# Patient Record
Sex: Male | Born: 1940
Health system: Southern US, Community
[De-identification: ages and names within clinical notes are randomized; demographics above are authoritative.]

## PROBLEM LIST (undated history)

## (undated) DIAGNOSIS — C801 Malignant (primary) neoplasm, unspecified: Secondary | ICD-10-CM

## (undated) DIAGNOSIS — N401 Enlarged prostate with lower urinary tract symptoms: Secondary | ICD-10-CM

## (undated) DIAGNOSIS — R351 Nocturia: Secondary | ICD-10-CM

## (undated) DIAGNOSIS — E785 Hyperlipidemia, unspecified: Secondary | ICD-10-CM

## (undated) DIAGNOSIS — I10 Essential (primary) hypertension: Secondary | ICD-10-CM

## (undated) DIAGNOSIS — Z974 Presence of external hearing-aid: Secondary | ICD-10-CM

## (undated) DIAGNOSIS — Z973 Presence of spectacles and contact lenses: Secondary | ICD-10-CM

## (undated) DIAGNOSIS — K219 Gastro-esophageal reflux disease without esophagitis: Secondary | ICD-10-CM

## (undated) DIAGNOSIS — M1990 Unspecified osteoarthritis, unspecified site: Secondary | ICD-10-CM

## (undated) HISTORY — PX: INGUINAL HERNIA REPAIR: SUR1180

## (undated) HISTORY — DX: Hyperlipidemia, unspecified: E78.5

## (undated) HISTORY — DX: Essential (primary) hypertension: I10

## (undated) HISTORY — PX: APPENDECTOMY: SHX54

## (undated) HISTORY — PX: TONSILLECTOMY: SUR1361

## (undated) HISTORY — PX: CATARACT EXTRACTION W/ INTRAOCULAR LENS  IMPLANT, BILATERAL: SHX1307

---

## 1974-09-11 HISTORY — PX: TYMPANOPLASTY: SHX33

## 2007-09-12 HISTORY — PX: ROTATOR CUFF REPAIR: SHX139

## 2011-09-18 DIAGNOSIS — N401 Enlarged prostate with lower urinary tract symptoms: Secondary | ICD-10-CM | POA: Diagnosis not present

## 2011-09-18 DIAGNOSIS — R972 Elevated prostate specific antigen [PSA]: Secondary | ICD-10-CM | POA: Diagnosis not present

## 2011-09-18 DIAGNOSIS — R3129 Other microscopic hematuria: Secondary | ICD-10-CM | POA: Diagnosis not present

## 2011-10-23 DIAGNOSIS — Z9089 Acquired absence of other organs: Secondary | ICD-10-CM | POA: Diagnosis not present

## 2011-10-23 DIAGNOSIS — Z1211 Encounter for screening for malignant neoplasm of colon: Secondary | ICD-10-CM | POA: Diagnosis not present

## 2011-10-23 DIAGNOSIS — K5730 Diverticulosis of large intestine without perforation or abscess without bleeding: Secondary | ICD-10-CM | POA: Diagnosis not present

## 2011-10-23 DIAGNOSIS — D126 Benign neoplasm of colon, unspecified: Secondary | ICD-10-CM | POA: Diagnosis not present

## 2011-10-23 DIAGNOSIS — K648 Other hemorrhoids: Secondary | ICD-10-CM | POA: Diagnosis not present

## 2011-10-23 DIAGNOSIS — N400 Enlarged prostate without lower urinary tract symptoms: Secondary | ICD-10-CM | POA: Diagnosis not present

## 2011-11-16 DIAGNOSIS — N401 Enlarged prostate with lower urinary tract symptoms: Secondary | ICD-10-CM | POA: Diagnosis not present

## 2011-11-16 DIAGNOSIS — R972 Elevated prostate specific antigen [PSA]: Secondary | ICD-10-CM | POA: Diagnosis not present

## 2011-12-06 DIAGNOSIS — N401 Enlarged prostate with lower urinary tract symptoms: Secondary | ICD-10-CM | POA: Diagnosis not present

## 2011-12-06 DIAGNOSIS — R3129 Other microscopic hematuria: Secondary | ICD-10-CM | POA: Diagnosis not present

## 2011-12-06 DIAGNOSIS — R972 Elevated prostate specific antigen [PSA]: Secondary | ICD-10-CM | POA: Diagnosis not present

## 2011-12-13 DIAGNOSIS — K648 Other hemorrhoids: Secondary | ICD-10-CM | POA: Diagnosis not present

## 2011-12-13 DIAGNOSIS — K625 Hemorrhage of anus and rectum: Secondary | ICD-10-CM | POA: Diagnosis not present

## 2011-12-27 DIAGNOSIS — K6289 Other specified diseases of anus and rectum: Secondary | ICD-10-CM | POA: Diagnosis not present

## 2011-12-27 DIAGNOSIS — K648 Other hemorrhoids: Secondary | ICD-10-CM | POA: Diagnosis not present

## 2012-01-10 DIAGNOSIS — K648 Other hemorrhoids: Secondary | ICD-10-CM | POA: Diagnosis not present

## 2012-01-10 DIAGNOSIS — K625 Hemorrhage of anus and rectum: Secondary | ICD-10-CM | POA: Diagnosis not present

## 2012-01-15 ENCOUNTER — Other Ambulatory Visit: Payer: Self-pay | Admitting: Internal Medicine

## 2012-01-15 DIAGNOSIS — R3129 Other microscopic hematuria: Secondary | ICD-10-CM

## 2012-01-17 ENCOUNTER — Ambulatory Visit
Admission: RE | Admit: 2012-01-17 | Discharge: 2012-01-17 | Disposition: A | Discharge status: Home / Self Care | Payer: Medicare Other | Source: Ambulatory Visit | Attending: Internal Medicine | Admitting: Internal Medicine

## 2012-01-17 DIAGNOSIS — N281 Cyst of kidney, acquired: Secondary | ICD-10-CM | POA: Diagnosis not present

## 2012-01-17 DIAGNOSIS — N400 Enlarged prostate without lower urinary tract symptoms: Secondary | ICD-10-CM | POA: Diagnosis not present

## 2012-01-17 DIAGNOSIS — N200 Calculus of kidney: Secondary | ICD-10-CM | POA: Diagnosis not present

## 2012-01-17 DIAGNOSIS — R3129 Other microscopic hematuria: Secondary | ICD-10-CM

## 2012-01-26 DIAGNOSIS — H2510 Age-related nuclear cataract, unspecified eye: Secondary | ICD-10-CM | POA: Diagnosis not present

## 2012-07-17 DIAGNOSIS — M899 Disorder of bone, unspecified: Secondary | ICD-10-CM | POA: Diagnosis not present

## 2012-07-17 DIAGNOSIS — E785 Hyperlipidemia, unspecified: Secondary | ICD-10-CM | POA: Diagnosis not present

## 2012-07-17 DIAGNOSIS — Z23 Encounter for immunization: Secondary | ICD-10-CM | POA: Diagnosis not present

## 2012-07-17 DIAGNOSIS — I10 Essential (primary) hypertension: Secondary | ICD-10-CM | POA: Diagnosis not present

## 2012-07-17 DIAGNOSIS — Z125 Encounter for screening for malignant neoplasm of prostate: Secondary | ICD-10-CM | POA: Diagnosis not present

## 2012-07-24 DIAGNOSIS — R972 Elevated prostate specific antigen [PSA]: Secondary | ICD-10-CM | POA: Diagnosis not present

## 2012-07-24 DIAGNOSIS — Z0000 Encounter for general adult medical examination without abnormal findings: Secondary | ICD-10-CM | POA: Diagnosis not present

## 2012-07-24 DIAGNOSIS — Z1331 Encounter for screening for depression: Secondary | ICD-10-CM | POA: Diagnosis not present

## 2012-07-24 DIAGNOSIS — I10 Essential (primary) hypertension: Secondary | ICD-10-CM | POA: Diagnosis not present

## 2012-07-24 DIAGNOSIS — E785 Hyperlipidemia, unspecified: Secondary | ICD-10-CM | POA: Diagnosis not present

## 2012-07-25 DIAGNOSIS — Z1212 Encounter for screening for malignant neoplasm of rectum: Secondary | ICD-10-CM | POA: Diagnosis not present

## 2012-07-30 DIAGNOSIS — M719 Bursopathy, unspecified: Secondary | ICD-10-CM | POA: Diagnosis not present

## 2012-07-30 DIAGNOSIS — M67919 Unspecified disorder of synovium and tendon, unspecified shoulder: Secondary | ICD-10-CM | POA: Diagnosis not present

## 2012-07-30 DIAGNOSIS — IMO0002 Reserved for concepts with insufficient information to code with codable children: Secondary | ICD-10-CM | POA: Diagnosis not present

## 2012-08-15 DIAGNOSIS — H903 Sensorineural hearing loss, bilateral: Secondary | ICD-10-CM | POA: Diagnosis not present

## 2012-08-15 DIAGNOSIS — H9319 Tinnitus, unspecified ear: Secondary | ICD-10-CM | POA: Diagnosis not present

## 2012-12-12 DIAGNOSIS — R21 Rash and other nonspecific skin eruption: Secondary | ICD-10-CM | POA: Diagnosis not present

## 2012-12-26 DIAGNOSIS — L988 Other specified disorders of the skin and subcutaneous tissue: Secondary | ICD-10-CM | POA: Diagnosis not present

## 2012-12-26 DIAGNOSIS — L989 Disorder of the skin and subcutaneous tissue, unspecified: Secondary | ICD-10-CM | POA: Diagnosis not present

## 2012-12-26 DIAGNOSIS — IMO0002 Reserved for concepts with insufficient information to code with codable children: Secondary | ICD-10-CM | POA: Diagnosis not present

## 2012-12-31 DIAGNOSIS — H2510 Age-related nuclear cataract, unspecified eye: Secondary | ICD-10-CM | POA: Diagnosis not present

## 2012-12-31 DIAGNOSIS — L989 Disorder of the skin and subcutaneous tissue, unspecified: Secondary | ICD-10-CM | POA: Diagnosis not present

## 2012-12-31 DIAGNOSIS — Z4802 Encounter for removal of sutures: Secondary | ICD-10-CM | POA: Diagnosis not present

## 2013-01-15 DIAGNOSIS — N200 Calculus of kidney: Secondary | ICD-10-CM | POA: Diagnosis not present

## 2013-01-15 DIAGNOSIS — R972 Elevated prostate specific antigen [PSA]: Secondary | ICD-10-CM | POA: Diagnosis not present

## 2013-01-15 DIAGNOSIS — N401 Enlarged prostate with lower urinary tract symptoms: Secondary | ICD-10-CM | POA: Diagnosis not present

## 2013-08-19 DIAGNOSIS — Z1212 Encounter for screening for malignant neoplasm of rectum: Secondary | ICD-10-CM | POA: Diagnosis not present

## 2013-09-30 DIAGNOSIS — H5210 Myopia, unspecified eye: Secondary | ICD-10-CM | POA: Diagnosis not present

## 2013-09-30 DIAGNOSIS — H2510 Age-related nuclear cataract, unspecified eye: Secondary | ICD-10-CM | POA: Diagnosis not present

## 2013-09-30 DIAGNOSIS — H3530 Unspecified macular degeneration: Secondary | ICD-10-CM | POA: Diagnosis not present

## 2013-10-16 DIAGNOSIS — H2589 Other age-related cataract: Secondary | ICD-10-CM | POA: Diagnosis not present

## 2013-10-16 DIAGNOSIS — H2510 Age-related nuclear cataract, unspecified eye: Secondary | ICD-10-CM | POA: Diagnosis not present

## 2014-01-27 DIAGNOSIS — D485 Neoplasm of uncertain behavior of skin: Secondary | ICD-10-CM | POA: Diagnosis not present

## 2014-01-27 DIAGNOSIS — L820 Inflamed seborrheic keratosis: Secondary | ICD-10-CM | POA: Diagnosis not present

## 2014-01-27 DIAGNOSIS — L821 Other seborrheic keratosis: Secondary | ICD-10-CM | POA: Diagnosis not present

## 2014-04-13 DIAGNOSIS — K4090 Unilateral inguinal hernia, without obstruction or gangrene, not specified as recurrent: Secondary | ICD-10-CM | POA: Diagnosis not present

## 2014-04-28 ENCOUNTER — Encounter (INDEPENDENT_AMBULATORY_CARE_PROVIDER_SITE_OTHER): Payer: Self-pay | Admitting: Surgery

## 2014-04-28 ENCOUNTER — Ambulatory Visit (INDEPENDENT_AMBULATORY_CARE_PROVIDER_SITE_OTHER): Payer: Medicare Other | Admitting: Surgery

## 2014-04-28 VITALS — BP 126/80 | HR 55 | Temp 97.0°F | Ht 63.0 in | Wt 131.0 lb

## 2014-04-28 DIAGNOSIS — N400 Enlarged prostate without lower urinary tract symptoms: Secondary | ICD-10-CM | POA: Diagnosis not present

## 2014-04-28 DIAGNOSIS — R109 Unspecified abdominal pain: Secondary | ICD-10-CM | POA: Diagnosis not present

## 2014-04-28 DIAGNOSIS — R1032 Left lower quadrant pain: Secondary | ICD-10-CM

## 2014-04-28 MED ORDER — NAPROXEN 500 MG PO TABS: 500.0000 mg | ORAL_TABLET | Freq: Two times a day (BID) | ORAL | Status: DC

## 2014-04-28 NOTE — Patient Instructions (Signed)
Please consider the recommendations that we have given you today:  Try a regimen of ice/heat/anti-inflammatory nonsteroidal (naproxen aka Aleve).  Do that regularly for 3 weeks.  If worse or not better, consider laparoscopic surgical exploration and repair of probable recurrent hernia.  See the Handout(s) we have given you.  Please call our office at [phone] if you wish to schedule surgery or if you have further questions / concerns.   Groin Strain A groin strain (also called a groin pull) is an injury to the muscles or tendon on the upper inner part of the thigh. These muscles are called the adductor muscles or groin muscles. They are responsible for moving the leg across the body. A muscle strain occurs when a muscle is overstretched and some muscle fibers are torn. A groin strain can range from mild to severe depending on how many muscle fibers are affected and whether the muscle fibers are partially or completely torn.  Groin strains usually occur during exercise or participation in sports. The injury often happens when a sudden, violent force is placed on a muscle, stretching the muscle too far. A strain is more likely to occur when your muscles are not warmed up or if you are not properly conditioned. Depending on the severity of the groin strain, recovery time may vary from a few weeks to several weeks. Severe injuries often require 4-6 weeks for recovery. In these cases, complete healing can take 4-5 months.  CAUSES   Stretching the groin muscles too far or too suddenly, often during side-to-side motion with an abrupt change in direction.  Putting repeated stress on the groin muscles over a long period of time.  Performing vigorous activity without properly stretching the groin muscles beforehand. SYMPTOMS   Pain and tenderness in the groin area. This begins as sharp pain and persists as a dull ache.  Popping or snapping feeling when the injury occurs (for severe  strains).  Swelling or bruising.  Muscle spasms.  Weakness in the leg.  Stiffness in the groin area with decreased ability to move the affected muscles. DIAGNOSIS  Your caregiver will perform a physical exam to diagnose a groin strain. You will be asked about your symptoms and how the injury occurred. X-rays are sometimes needed to rule out a broken bone or cartilage problems. Your caregiver may order a CT scan or MRI if a complete muscle tear is suspected. TREATMENT  A groin strain will often heal on its own. Your caregiver may prescribe medicines to help manage pain and swelling (anti-inflammatory medicine). You may be told to use crutches for the first few days to minimize your pain. HOME CARE INSTRUCTIONS   Rest. Do not use the strained muscle if it causes pain.  Put ice on the injured area.  Put ice in a plastic bag.  Place a towel between your skin and the bag.  Leave the ice on for 15-20 minutes, every 2-3 hours. Do this for the first 2 days after the injury.  Only take over-the-counter or prescription medicines as directed by your caregiver.  Wrap the injured area with an elastic bandage as directed by your caregiver.  Keep the injured leg raised (elevated).  Walk, stretch, and perform range-of-motion exercises to improve blood flow to the injured area. Only perform these activities if you can do so without any pain. To prevent muscle strains:  Warm up before exercise.  Develop proper conditioning and strength in the groin muscles. SEEK IMMEDIATE MEDICAL CARE IF:  You have increased pain or swelling in the affected area.   Your symptoms are not improving or are getting worse. MAKE SURE YOU:   Understand these instructions.  Will watch your condition.  Will get help right away if you are not doing well or get worse. Document Released: 04/25/2004 Document Revised: 08/14/2012 Document Reviewed: 05/01/2012 [facility] Patient Information 2015 [place], [place].  This information is not intended to replace advice given to you by your health care provider. Make sure you discuss any questions you have with your health care provider.  Managing Pain  Pain after surgery or related to activity is often due to strain/injury to muscle, tendon, nerves and/or incisions.  This pain is usually short-term and will improve in a few months.   Many people find it helpful to do the following things TOGETHER to help speed the process of healing and to get back to regular activity more quickly:  1. Avoid heavy physical activity a.  no lifting greater than 20 pounds b. Do not "push through" the pain.  Listen to your body and avoid positions and maneuvers than reproduce the pain c. Walking is okay as tolerated, but go slowly and stop when getting sore.  d. Remember: If it hurts to do it, then don't do it! 2. Take Anti-inflammatory medication  a. Take with food/snack around the clock for 1-2 weeks i. This helps the muscle and nerve tissues become less irritable and calm down faster b. Choose ONE of the following over-the-counter medications: i. Naproxen 23m tabs (ex. Aleve) 1-2 pills twice a day  ii. Ibuprofen 2075mtabs (ex. Advil, Motrin) 3-4 pills with every meal and just before bedtime iii. Acetaminophen 50020mabs (Tylenol) 1-2 pills with every meal and just before bedtime 3. Use a Heating pad or Ice/Cold Pack a. 4-6 times a day b. May use warm bath/hottub  or showers 4. Try Gentle Massage and/or Stretching  a. at the area of pain many times a day b. stop if you feel pain - do not overdo it  Try these steps together to help you body heal faster and avoid making things get worse.  Doing just one of these things may not be enough.    If you are not getting better after two weeks or are noticing you are getting worse, contact our office for further advice; we may need to re-evaluate you & see what other things we can do to help.    Hernia A hernia occurs when  an internal organ pushes out through a weak spot in the abdominal wall. Hernias most commonly occur in the groin and around the navel. Hernias often can be pushed back into place (reduced). Most hernias tend to get worse over time. Some abdominal hernias can get stuck in the opening (irreducible or incarcerated hernia) and cannot be reduced. An irreducible abdominal hernia which is tightly squeezed into the opening is at risk for impaired blood supply (strangulated hernia). A strangulated hernia is a medical emergency. Because of the risk for an irreducible or strangulated hernia, surgery may be recommended to repair a hernia. CAUSES   Heavy lifting.  Prolonged coughing.  Straining to have a bowel movement.  A cut (incision) made during an abdominal surgery. HOME CARE INSTRUCTIONS   Bed rest is not required. You may continue your normal activities.  Avoid lifting more than 10 pounds (4.5 kg) or straining.  Cough gently. If you are a smoker it is best to stop. Even the best hernia repair can  break down with the continual strain of coughing. Even if you do not have your hernia repaired, a cough will continue to aggravate the problem.  Do not wear anything tight over your hernia. Do not try to keep it in with an outside bandage or truss. These can damage abdominal contents if they are trapped within the hernia sac.  Eat a normal diet.  Avoid constipation. Straining over long periods of time will increase hernia size and encourage breakdown of repairs. If you cannot do this with diet alone, stool softeners may be used. SEEK IMMEDIATE MEDICAL CARE IF:   You have a fever.  You develop increasing abdominal pain.  You feel nauseous or vomit.  Your hernia is stuck outside the abdomen, looks discolored, feels hard, or is tender.  You have any changes in your bowel habits or in the hernia that are unusual for you.  You have increased pain or swelling around the hernia.  You cannot push the  hernia back in place by applying gentle pressure while lying down. MAKE SURE YOU:   Understand these instructions.  Will watch your condition.  Will get help right away if you are not doing well or get worse. Document Released: 08/28/2005 Document Revised: 11/20/2011 Document Reviewed: 04/16/2008 [facility]  Patient Information 2015 [employer] [employer], [place]. This information is not intended to replace advice given to you by your health care provider. Make sure you discuss any questions you have with your health care provider.  Exercise to Stay Healthy Exercise helps you become and stay healthy. EXERCISE IDEAS AND TIPS Choose exercises that:  You enjoy.  Fit into your day. You do not need to exercise really hard to be healthy. You can do exercises at a slow or medium level and stay healthy. You can:  Stretch before and after working out.  Try yoga, Pilates, or tai chi.  Lift weights.  Walk fast, swim, jog, run, climb stairs, bicycle, dance, or rollerskate.  Take aerobic classes. Exercises that burn about 150 calories:  Running 1  miles in 15 minutes.  Playing volleyball for 45 to 60 minutes.  Washing and waxing a car for 45 to 60 minutes.  Playing touch football for 45 minutes.  Walking 1  miles in 35 minutes.  Pushing a stroller 1  miles in 30 minutes.  Playing basketball for 30 minutes.  Raking leaves for 30 minutes.  Bicycling 5 miles in 30 minutes.  Walking 2 miles in 30 minutes.  Dancing for 30 minutes.  Shoveling snow for 15 minutes.  Swimming laps for 20 minutes.  Walking up stairs for 15 minutes.  Bicycling 4 miles in 15 minutes.  Gardening for 30 to 45 minutes.  Jumping rope for 15 minutes.  Washing windows or floors for 45 to 60 minutes. Document Released: 09/30/2010 Document Revised: 11/20/2011 Document Reviewed: 09/30/2010 [facility] Patient Information 2015 [place], [place]. This information is not intended to replace advice given to you by your  health care provider. Make sure you discuss any questions you have with your health care provider.

## 2014-04-28 NOTE — Progress Notes (Signed)
Subjective:     Patient ID: Todd Myers, male   DOB: Jan 22, 1941, 73 y.o.   MRN: [id number]  HPI  Note: Portions of this report may have been transcribed using voice recognition software. Every effort was made to ensure accuracy; however, inadvertent computerized transcription errors may be present.   Any transcriptional errors that result from this process are unintentional.            Todd Myers  05-Feb-1941 [id number]  Patient Care Team: Marton Redwood, MD as PCP - General (Internal Medicine) Avanell Shackleton, NP as Nurse Practitioner (Internal Medicine)  This patient is a 73 y.o.male who presents today for surgical evaluation at the request of Darrelyn Hillock, NP.   Reason for visit: Left groin pain/swelling ?recurrent ehernia  Pleasant active male.  He comes today by himself.  History of inguinal hernias repaired.  1st time in [place], [place].  Recurred.  Rerepaired at [place], [place] in 1990s.  Open incisions.  He started working out at the gym to get a better shape and lose weight.  Noticed worsening left groin pain with lifting activities.  Did not resolve.  Persisted for a while.  He felt some groin swelling.  He went to his primary care office.  They were concerned about hernia recurrence.  Surgical consultation requested.  History of elevated PSA but negative biopsies.  No difficulty with urination.  Has bowel movements every day.  Can walk 4 miles without difficulty.  No history of infections.  Has not smoked in over 2 decades.  There are no active problems to display for this patient.  History reviewed. No pertinent past medical history.  Past Surgical History  Procedure Laterality Date  . Appendectomy    . Hernia repair    . Rotator cuff repair    . Cataract extraction      History   Social History  . Marital Status: Married    Spouse Name: N/A    Number of Children: N/A  . Years of Education: N/A   Occupational History  . Not on file.   Social  History Main Topics  . Smoking status: Former Smoker    Types: Cigarettes    Quit date: 04/29/1991  . Smokeless tobacco: Not on file  . Alcohol Use: Yes  . Drug Use: No  . Sexual Activity: Not on file   Other Topics Concern  . Not on file   Social History Narrative  . No narrative on file    Family History  Problem Relation Age of Onset  . COPD Mother   . Heart disease Father     Current Outpatient Prescriptions  Medication Sig Dispense Refill  . aspirin 81 MG tablet Take 81 mg by mouth daily.      . cholecalciferol (VITAMIN D) 400 UNITS TABS tablet Take 400 Units by mouth.      . irbesartan (AVAPRO) 150 MG tablet       . simvastatin (ZOCOR) 20 MG tablet        No current facility-administered medications for this visit.     Allergies  Allergen Reactions  . Sulfa Antibiotics     BP 126/80  Pulse 55  Temp(Src) 97 F (36.1 C)  Ht 5\' 3"  (1.6 m)  Wt 131 lb (59.421 kg)  BMI 23.21 kg/m2  No results found.   Review of Systems  Constitutional: Negative for fever, chills and diaphoresis.  HENT: Negative for ear discharge, facial swelling, mouth sores, nosebleeds, sore throat and trouble swallowing.  Eyes: Negative for photophobia, discharge and visual disturbance.  Respiratory: Negative for choking, chest tightness, shortness of breath and stridor.   Cardiovascular: Negative for chest pain and palpitations.  Gastrointestinal: Negative for nausea, vomiting, abdominal pain, diarrhea, constipation, blood in stool, abdominal distention, anal bleeding and rectal pain.  Endocrine: Negative for cold intolerance and heat intolerance.  Genitourinary: Negative for dysuria, urgency, difficulty urinating and testicular pain.  Musculoskeletal: Negative for arthralgias, back pain, gait problem and myalgias.  Skin: Negative for color change, pallor, rash and wound.  Allergic/Immunologic: Negative for environmental allergies and food allergies.  Neurological: Negative for  dizziness, speech difficulty, weakness, numbness and headaches.  Hematological: Negative for adenopathy. Does not bruise/bleed easily.  Psychiatric/Behavioral: Negative for hallucinations, confusion and agitation.       Objective:   Physical Exam  Constitutional: He is oriented to person, place, and time. He appears well-developed and well-nourished. No distress.  HENT:  Head: Normocephalic.  Mouth/Throat: Oropharynx is clear and moist. No oropharyngeal exudate.  Eyes: Conjunctivae and EOM are normal. Pupils are equal, round, and reactive to light. No scleral icterus.  Neck: Normal range of motion. Neck supple. No tracheal deviation present.  Cardiovascular: Normal rate, regular rhythm and intact distal pulses.   Pulmonary/Chest: Effort normal and breath sounds normal. No respiratory distress.  Abdominal: Soft. He exhibits no distension. There is no tenderness. Hernia confirmed negative in the right inguinal area.  Genitourinary: Penis normal.    Right testis shows no mass, no swelling and no tenderness. Left testis shows no mass, no swelling and no tenderness. Uncircumcised.  Musculoskeletal: Normal range of motion. He exhibits no tenderness.  Lymphadenopathy:    He has no cervical adenopathy.       Right: No inguinal adenopathy present.       Left: No inguinal adenopathy present.  Neurological: He is alert and oriented to person, place, and time. No cranial nerve deficit. He exhibits normal muscle tone. Coordination normal.  Skin: Skin is warm and dry. No rash noted. He is not diaphoretic. No erythema. No pallor.  Psychiatric: He has a normal mood and affect. His behavior is normal. Judgment and thought content normal.       Assessment:     The left groin pain after intense physical work canal.  Probable groin strain vs. Hernia recurrence.     Plan:     Because he has had recurrent inguinal hernias, this could be another one.  However, I do not feel a definite one on  exam.  It is reasonable to do a trial of anti-inflammatory treatment.  IF his symptoms totally resolve in 3-6 weeks, followup when necessary.  If symptoms do not resolve, or if they worsen, or if he has a new swelling; then, consider laparoscopic exploration and repair of probable recurrent hernias.  May have femoral hernia since radiates down to left inner thigh.  He prefers to try close observation and anti-inflammatories before proceeding with surgery:  The anatomy & physiology of the abdominal wall and pelvic floor was discussed.  The pathophysiology of hernias in the inguinal and pelvic region was discussed.  Natural history risks such as progressive enlargement, pain, incarceration & strangulation was discussed.   Contributors to complications such as smoking, obesity, diabetes, prior surgery, etc were discussed.    I feel the risks of no intervention will lead to serious problems that outweigh the operative risks; therefore, I recommended surgery to reduce and repair the hernia.  I explained laparoscopic techniques with possible need  for an open approach.  I noted usual use of mesh to patch and/or buttress hernia repair  Risks such as bleeding, infection, abscess, need for further treatment, heart attack, death, and other risks were discussed.  I noted a good likelihood this will help address the problem.   Goals of post-operative recovery were discussed as well.  Possibility that this will not correct all symptoms was explained.  I stressed the importance of low-impact activity, aggressive pain control, avoiding constipation, & not pushing through pain to minimize risk of post-operative chronic pain or injury. Possibility of reherniation was discussed.  We will work to minimize complications.     An educational handout further explaining the pathology & treatment options was given as well.  Questions were answered.  The patient expresses understanding.

## 2014-05-15 DIAGNOSIS — R109 Unspecified abdominal pain: Secondary | ICD-10-CM | POA: Diagnosis not present

## 2014-06-30 DIAGNOSIS — Z23 Encounter for immunization: Secondary | ICD-10-CM | POA: Diagnosis not present

## 2014-07-29 DIAGNOSIS — I10 Essential (primary) hypertension: Secondary | ICD-10-CM | POA: Diagnosis not present

## 2014-07-29 DIAGNOSIS — Z0000 Encounter for general adult medical examination without abnormal findings: Secondary | ICD-10-CM | POA: Diagnosis not present

## 2014-07-29 DIAGNOSIS — R8299 Other abnormal findings in urine: Secondary | ICD-10-CM | POA: Diagnosis not present

## 2014-07-29 DIAGNOSIS — M859 Disorder of bone density and structure, unspecified: Secondary | ICD-10-CM | POA: Diagnosis not present

## 2014-07-29 DIAGNOSIS — Z125 Encounter for screening for malignant neoplasm of prostate: Secondary | ICD-10-CM | POA: Diagnosis not present

## 2014-07-29 DIAGNOSIS — E785 Hyperlipidemia, unspecified: Secondary | ICD-10-CM | POA: Diagnosis not present

## 2014-08-03 DIAGNOSIS — Z6823 Body mass index (BMI) 23.0-23.9, adult: Secondary | ICD-10-CM | POA: Diagnosis not present

## 2014-08-03 DIAGNOSIS — R972 Elevated prostate specific antigen [PSA]: Secondary | ICD-10-CM | POA: Diagnosis not present

## 2014-08-03 DIAGNOSIS — Z23 Encounter for immunization: Secondary | ICD-10-CM | POA: Diagnosis not present

## 2014-08-03 DIAGNOSIS — E785 Hyperlipidemia, unspecified: Secondary | ICD-10-CM | POA: Diagnosis not present

## 2014-08-03 DIAGNOSIS — N401 Enlarged prostate with lower urinary tract symptoms: Secondary | ICD-10-CM | POA: Diagnosis not present

## 2014-08-03 DIAGNOSIS — M542 Cervicalgia: Secondary | ICD-10-CM | POA: Diagnosis not present

## 2014-08-03 DIAGNOSIS — M8580 Other specified disorders of bone density and structure, unspecified site: Secondary | ICD-10-CM | POA: Diagnosis not present

## 2014-08-03 DIAGNOSIS — Z0000 Encounter for general adult medical examination without abnormal findings: Secondary | ICD-10-CM | POA: Diagnosis not present

## 2014-08-03 DIAGNOSIS — I10 Essential (primary) hypertension: Secondary | ICD-10-CM | POA: Diagnosis not present

## 2014-08-03 DIAGNOSIS — Z1389 Encounter for screening for other disorder: Secondary | ICD-10-CM | POA: Diagnosis not present

## 2014-08-05 DIAGNOSIS — M859 Disorder of bone density and structure, unspecified: Secondary | ICD-10-CM | POA: Diagnosis not present

## 2014-08-07 DIAGNOSIS — Z1212 Encounter for screening for malignant neoplasm of rectum: Secondary | ICD-10-CM | POA: Diagnosis not present

## 2014-08-19 DIAGNOSIS — R3916 Straining to void: Secondary | ICD-10-CM | POA: Diagnosis not present

## 2014-08-19 DIAGNOSIS — N529 Male erectile dysfunction, unspecified: Secondary | ICD-10-CM | POA: Diagnosis not present

## 2014-08-19 DIAGNOSIS — R972 Elevated prostate specific antigen [PSA]: Secondary | ICD-10-CM | POA: Diagnosis not present

## 2014-08-19 DIAGNOSIS — N401 Enlarged prostate with lower urinary tract symptoms: Secondary | ICD-10-CM | POA: Diagnosis not present

## 2014-08-24 DIAGNOSIS — M25511 Pain in right shoulder: Secondary | ICD-10-CM | POA: Diagnosis not present

## 2014-08-24 DIAGNOSIS — M542 Cervicalgia: Secondary | ICD-10-CM | POA: Diagnosis not present

## 2014-08-27 DIAGNOSIS — M542 Cervicalgia: Secondary | ICD-10-CM | POA: Diagnosis not present

## 2014-08-27 DIAGNOSIS — M25511 Pain in right shoulder: Secondary | ICD-10-CM | POA: Diagnosis not present

## 2014-09-08 DIAGNOSIS — M542 Cervicalgia: Secondary | ICD-10-CM | POA: Diagnosis not present

## 2014-09-08 DIAGNOSIS — M25511 Pain in right shoulder: Secondary | ICD-10-CM | POA: Diagnosis not present

## 2014-10-13 DIAGNOSIS — M542 Cervicalgia: Secondary | ICD-10-CM | POA: Diagnosis not present

## 2014-10-13 DIAGNOSIS — M25511 Pain in right shoulder: Secondary | ICD-10-CM | POA: Diagnosis not present

## 2014-10-20 DIAGNOSIS — M25511 Pain in right shoulder: Secondary | ICD-10-CM | POA: Diagnosis not present

## 2014-10-20 DIAGNOSIS — M542 Cervicalgia: Secondary | ICD-10-CM | POA: Diagnosis not present

## 2014-10-22 DIAGNOSIS — M25511 Pain in right shoulder: Secondary | ICD-10-CM | POA: Diagnosis not present

## 2014-10-22 DIAGNOSIS — M542 Cervicalgia: Secondary | ICD-10-CM | POA: Diagnosis not present

## 2014-10-27 DIAGNOSIS — M542 Cervicalgia: Secondary | ICD-10-CM | POA: Diagnosis not present

## 2014-10-27 DIAGNOSIS — M25511 Pain in right shoulder: Secondary | ICD-10-CM | POA: Diagnosis not present

## 2015-01-04 DIAGNOSIS — H3531 Nonexudative age-related macular degeneration: Secondary | ICD-10-CM | POA: Diagnosis not present

## 2015-01-04 DIAGNOSIS — H2512 Age-related nuclear cataract, left eye: Secondary | ICD-10-CM | POA: Diagnosis not present

## 2015-01-04 DIAGNOSIS — H5213 Myopia, bilateral: Secondary | ICD-10-CM | POA: Diagnosis not present

## 2015-03-02 DIAGNOSIS — D1801 Hemangioma of skin and subcutaneous tissue: Secondary | ICD-10-CM | POA: Diagnosis not present

## 2015-03-02 DIAGNOSIS — L570 Actinic keratosis: Secondary | ICD-10-CM | POA: Diagnosis not present

## 2015-03-02 DIAGNOSIS — L821 Other seborrheic keratosis: Secondary | ICD-10-CM | POA: Diagnosis not present

## 2015-06-25 DIAGNOSIS — H01005 Unspecified blepharitis left lower eyelid: Secondary | ICD-10-CM | POA: Diagnosis not present

## 2015-06-25 DIAGNOSIS — H01004 Unspecified blepharitis left upper eyelid: Secondary | ICD-10-CM | POA: Diagnosis not present

## 2015-06-25 DIAGNOSIS — H01001 Unspecified blepharitis right upper eyelid: Secondary | ICD-10-CM | POA: Diagnosis not present

## 2015-07-30 DIAGNOSIS — R8290 Unspecified abnormal findings in urine: Secondary | ICD-10-CM | POA: Diagnosis not present

## 2015-07-30 DIAGNOSIS — Z125 Encounter for screening for malignant neoplasm of prostate: Secondary | ICD-10-CM | POA: Diagnosis not present

## 2015-07-30 DIAGNOSIS — E785 Hyperlipidemia, unspecified: Secondary | ICD-10-CM | POA: Diagnosis not present

## 2015-07-30 DIAGNOSIS — M859 Disorder of bone density and structure, unspecified: Secondary | ICD-10-CM | POA: Diagnosis not present

## 2015-07-30 DIAGNOSIS — I10 Essential (primary) hypertension: Secondary | ICD-10-CM | POA: Diagnosis not present

## 2015-08-06 DIAGNOSIS — E784 Other hyperlipidemia: Secondary | ICD-10-CM | POA: Diagnosis not present

## 2015-08-06 DIAGNOSIS — N401 Enlarged prostate with lower urinary tract symptoms: Secondary | ICD-10-CM | POA: Diagnosis not present

## 2015-08-06 DIAGNOSIS — M25519 Pain in unspecified shoulder: Secondary | ICD-10-CM | POA: Diagnosis not present

## 2015-08-06 DIAGNOSIS — M859 Disorder of bone density and structure, unspecified: Secondary | ICD-10-CM | POA: Diagnosis not present

## 2015-08-06 DIAGNOSIS — I10 Essential (primary) hypertension: Secondary | ICD-10-CM | POA: Diagnosis not present

## 2015-08-06 DIAGNOSIS — Z0000 Encounter for general adult medical examination without abnormal findings: Secondary | ICD-10-CM | POA: Diagnosis not present

## 2015-08-06 DIAGNOSIS — H8110 Benign paroxysmal vertigo, unspecified ear: Secondary | ICD-10-CM | POA: Diagnosis not present

## 2015-08-06 DIAGNOSIS — R972 Elevated prostate specific antigen [PSA]: Secondary | ICD-10-CM | POA: Diagnosis not present

## 2015-08-06 DIAGNOSIS — Z23 Encounter for immunization: Secondary | ICD-10-CM | POA: Diagnosis not present

## 2015-08-06 DIAGNOSIS — Z1389 Encounter for screening for other disorder: Secondary | ICD-10-CM | POA: Diagnosis not present

## 2015-08-06 DIAGNOSIS — R3129 Other microscopic hematuria: Secondary | ICD-10-CM | POA: Diagnosis not present

## 2015-08-13 DIAGNOSIS — Z1212 Encounter for screening for malignant neoplasm of rectum: Secondary | ICD-10-CM | POA: Diagnosis not present

## 2015-08-25 DIAGNOSIS — R3916 Straining to void: Secondary | ICD-10-CM | POA: Diagnosis not present

## 2015-08-25 DIAGNOSIS — R972 Elevated prostate specific antigen [PSA]: Secondary | ICD-10-CM | POA: Diagnosis not present

## 2015-08-25 DIAGNOSIS — N5201 Erectile dysfunction due to arterial insufficiency: Secondary | ICD-10-CM | POA: Diagnosis not present

## 2015-08-25 DIAGNOSIS — N401 Enlarged prostate with lower urinary tract symptoms: Secondary | ICD-10-CM | POA: Diagnosis not present

## 2016-01-05 DIAGNOSIS — H40012 Open angle with borderline findings, low risk, left eye: Secondary | ICD-10-CM | POA: Diagnosis not present

## 2016-01-05 DIAGNOSIS — Z0100 Encounter for examination of eyes and vision without abnormal findings: Secondary | ICD-10-CM | POA: Diagnosis not present

## 2016-01-05 DIAGNOSIS — H2512 Age-related nuclear cataract, left eye: Secondary | ICD-10-CM | POA: Diagnosis not present

## 2016-01-05 DIAGNOSIS — H40011 Open angle with borderline findings, low risk, right eye: Secondary | ICD-10-CM | POA: Diagnosis not present

## 2016-02-22 DIAGNOSIS — J069 Acute upper respiratory infection, unspecified: Secondary | ICD-10-CM | POA: Diagnosis not present

## 2016-02-28 DIAGNOSIS — Z6824 Body mass index (BMI) 24.0-24.9, adult: Secondary | ICD-10-CM | POA: Diagnosis not present

## 2016-02-28 DIAGNOSIS — J209 Acute bronchitis, unspecified: Secondary | ICD-10-CM | POA: Diagnosis not present

## 2016-02-28 DIAGNOSIS — R05 Cough: Secondary | ICD-10-CM | POA: Diagnosis not present

## 2016-03-20 DIAGNOSIS — H0014 Chalazion left upper eyelid: Secondary | ICD-10-CM | POA: Diagnosis not present

## 2016-03-30 DIAGNOSIS — H2512 Age-related nuclear cataract, left eye: Secondary | ICD-10-CM | POA: Diagnosis not present

## 2016-03-30 DIAGNOSIS — H2181 Floppy iris syndrome: Secondary | ICD-10-CM | POA: Diagnosis not present

## 2016-03-30 DIAGNOSIS — H25812 Combined forms of age-related cataract, left eye: Secondary | ICD-10-CM | POA: Diagnosis not present

## 2016-05-05 DIAGNOSIS — L820 Inflamed seborrheic keratosis: Secondary | ICD-10-CM | POA: Diagnosis not present

## 2016-05-05 DIAGNOSIS — L603 Nail dystrophy: Secondary | ICD-10-CM | POA: Diagnosis not present

## 2016-05-05 DIAGNOSIS — L821 Other seborrheic keratosis: Secondary | ICD-10-CM | POA: Diagnosis not present

## 2016-05-05 DIAGNOSIS — D1801 Hemangioma of skin and subcutaneous tissue: Secondary | ICD-10-CM | POA: Diagnosis not present

## 2016-05-05 DIAGNOSIS — D485 Neoplasm of uncertain behavior of skin: Secondary | ICD-10-CM | POA: Diagnosis not present

## 2016-05-05 DIAGNOSIS — L570 Actinic keratosis: Secondary | ICD-10-CM | POA: Diagnosis not present

## 2016-05-05 DIAGNOSIS — D225 Melanocytic nevi of trunk: Secondary | ICD-10-CM | POA: Diagnosis not present

## 2016-06-13 DIAGNOSIS — H0015 Chalazion left lower eyelid: Secondary | ICD-10-CM | POA: Diagnosis not present

## 2016-06-27 DIAGNOSIS — H0015 Chalazion left lower eyelid: Secondary | ICD-10-CM | POA: Diagnosis not present

## 2016-08-07 DIAGNOSIS — Z0000 Encounter for general adult medical examination without abnormal findings: Secondary | ICD-10-CM | POA: Diagnosis not present

## 2016-08-07 DIAGNOSIS — M859 Disorder of bone density and structure, unspecified: Secondary | ICD-10-CM | POA: Diagnosis not present

## 2016-08-07 DIAGNOSIS — E785 Hyperlipidemia, unspecified: Secondary | ICD-10-CM | POA: Diagnosis not present

## 2016-08-07 DIAGNOSIS — J209 Acute bronchitis, unspecified: Secondary | ICD-10-CM | POA: Diagnosis not present

## 2016-08-07 DIAGNOSIS — N401 Enlarged prostate with lower urinary tract symptoms: Secondary | ICD-10-CM | POA: Diagnosis not present

## 2016-08-07 DIAGNOSIS — E784 Other hyperlipidemia: Secondary | ICD-10-CM | POA: Diagnosis not present

## 2016-08-07 DIAGNOSIS — M810 Age-related osteoporosis without current pathological fracture: Secondary | ICD-10-CM | POA: Diagnosis not present

## 2016-08-07 DIAGNOSIS — R05 Cough: Secondary | ICD-10-CM | POA: Diagnosis not present

## 2016-08-07 DIAGNOSIS — Z125 Encounter for screening for malignant neoplasm of prostate: Secondary | ICD-10-CM | POA: Diagnosis not present

## 2016-08-07 DIAGNOSIS — I10 Essential (primary) hypertension: Secondary | ICD-10-CM | POA: Diagnosis not present

## 2016-08-07 DIAGNOSIS — Z6824 Body mass index (BMI) 24.0-24.9, adult: Secondary | ICD-10-CM | POA: Diagnosis not present

## 2016-08-14 DIAGNOSIS — Z125 Encounter for screening for malignant neoplasm of prostate: Secondary | ICD-10-CM | POA: Diagnosis not present

## 2016-08-14 DIAGNOSIS — M25511 Pain in right shoulder: Secondary | ICD-10-CM | POA: Diagnosis not present

## 2016-08-14 DIAGNOSIS — Z6824 Body mass index (BMI) 24.0-24.9, adult: Secondary | ICD-10-CM | POA: Diagnosis not present

## 2016-08-14 DIAGNOSIS — E784 Other hyperlipidemia: Secondary | ICD-10-CM | POA: Diagnosis not present

## 2016-08-14 DIAGNOSIS — I10 Essential (primary) hypertension: Secondary | ICD-10-CM | POA: Diagnosis not present

## 2016-08-14 DIAGNOSIS — M859 Disorder of bone density and structure, unspecified: Secondary | ICD-10-CM | POA: Diagnosis not present

## 2016-08-14 DIAGNOSIS — R972 Elevated prostate specific antigen [PSA]: Secondary | ICD-10-CM | POA: Diagnosis not present

## 2016-08-14 DIAGNOSIS — Z23 Encounter for immunization: Secondary | ICD-10-CM | POA: Diagnosis not present

## 2016-08-14 DIAGNOSIS — Z1389 Encounter for screening for other disorder: Secondary | ICD-10-CM | POA: Diagnosis not present

## 2016-08-14 DIAGNOSIS — N401 Enlarged prostate with lower urinary tract symptoms: Secondary | ICD-10-CM | POA: Diagnosis not present

## 2016-08-14 DIAGNOSIS — Z0000 Encounter for general adult medical examination without abnormal findings: Secondary | ICD-10-CM | POA: Diagnosis not present

## 2016-08-14 DIAGNOSIS — M542 Cervicalgia: Secondary | ICD-10-CM | POA: Diagnosis not present

## 2016-08-24 DIAGNOSIS — Z1212 Encounter for screening for malignant neoplasm of rectum: Secondary | ICD-10-CM | POA: Diagnosis not present

## 2016-08-30 DIAGNOSIS — N5201 Erectile dysfunction due to arterial insufficiency: Secondary | ICD-10-CM | POA: Diagnosis not present

## 2016-08-30 DIAGNOSIS — R3912 Poor urinary stream: Secondary | ICD-10-CM | POA: Diagnosis not present

## 2016-08-30 DIAGNOSIS — N401 Enlarged prostate with lower urinary tract symptoms: Secondary | ICD-10-CM | POA: Diagnosis not present

## 2016-08-30 DIAGNOSIS — R972 Elevated prostate specific antigen [PSA]: Secondary | ICD-10-CM | POA: Diagnosis not present

## 2016-10-06 DIAGNOSIS — M859 Disorder of bone density and structure, unspecified: Secondary | ICD-10-CM | POA: Diagnosis not present

## 2016-10-27 DIAGNOSIS — H40013 Open angle with borderline findings, low risk, bilateral: Secondary | ICD-10-CM | POA: Diagnosis not present

## 2016-10-27 DIAGNOSIS — H21233 Degeneration of iris (pigmentary), bilateral: Secondary | ICD-10-CM | POA: Diagnosis not present

## 2017-03-07 DIAGNOSIS — H1131 Conjunctival hemorrhage, right eye: Secondary | ICD-10-CM | POA: Diagnosis not present

## 2017-04-02 DIAGNOSIS — T1512XA Foreign body in conjunctival sac, left eye, initial encounter: Secondary | ICD-10-CM | POA: Diagnosis not present

## 2017-04-02 DIAGNOSIS — H00012 Hordeolum externum right lower eyelid: Secondary | ICD-10-CM | POA: Diagnosis not present

## 2017-04-27 DIAGNOSIS — H26492 Other secondary cataract, left eye: Secondary | ICD-10-CM | POA: Diagnosis not present

## 2017-04-27 DIAGNOSIS — H21233 Degeneration of iris (pigmentary), bilateral: Secondary | ICD-10-CM | POA: Diagnosis not present

## 2017-04-27 DIAGNOSIS — H40013 Open angle with borderline findings, low risk, bilateral: Secondary | ICD-10-CM | POA: Diagnosis not present

## 2017-04-27 DIAGNOSIS — H5213 Myopia, bilateral: Secondary | ICD-10-CM | POA: Diagnosis not present

## 2017-06-29 DIAGNOSIS — D1801 Hemangioma of skin and subcutaneous tissue: Secondary | ICD-10-CM | POA: Diagnosis not present

## 2017-06-29 DIAGNOSIS — L821 Other seborrheic keratosis: Secondary | ICD-10-CM | POA: Diagnosis not present

## 2017-06-29 DIAGNOSIS — D225 Melanocytic nevi of trunk: Secondary | ICD-10-CM | POA: Diagnosis not present

## 2017-06-29 DIAGNOSIS — L570 Actinic keratosis: Secondary | ICD-10-CM | POA: Diagnosis not present

## 2017-06-29 DIAGNOSIS — L603 Nail dystrophy: Secondary | ICD-10-CM | POA: Diagnosis not present

## 2017-08-09 DIAGNOSIS — I10 Essential (primary) hypertension: Secondary | ICD-10-CM | POA: Diagnosis not present

## 2017-08-09 DIAGNOSIS — M859 Disorder of bone density and structure, unspecified: Secondary | ICD-10-CM | POA: Diagnosis not present

## 2017-08-09 DIAGNOSIS — R82998 Other abnormal findings in urine: Secondary | ICD-10-CM | POA: Diagnosis not present

## 2017-08-09 DIAGNOSIS — Z125 Encounter for screening for malignant neoplasm of prostate: Secondary | ICD-10-CM | POA: Diagnosis not present

## 2017-08-09 DIAGNOSIS — E7849 Other hyperlipidemia: Secondary | ICD-10-CM | POA: Diagnosis not present

## 2017-08-16 DIAGNOSIS — Z23 Encounter for immunization: Secondary | ICD-10-CM | POA: Diagnosis not present

## 2017-08-16 DIAGNOSIS — Z0000 Encounter for general adult medical examination without abnormal findings: Secondary | ICD-10-CM | POA: Diagnosis not present

## 2017-08-16 DIAGNOSIS — E7849 Other hyperlipidemia: Secondary | ICD-10-CM | POA: Diagnosis not present

## 2017-08-16 DIAGNOSIS — Z6824 Body mass index (BMI) 24.0-24.9, adult: Secondary | ICD-10-CM | POA: Diagnosis not present

## 2017-08-16 DIAGNOSIS — R972 Elevated prostate specific antigen [PSA]: Secondary | ICD-10-CM | POA: Diagnosis not present

## 2017-08-16 DIAGNOSIS — N401 Enlarged prostate with lower urinary tract symptoms: Secondary | ICD-10-CM | POA: Diagnosis not present

## 2017-08-16 DIAGNOSIS — M7061 Trochanteric bursitis, right hip: Secondary | ICD-10-CM | POA: Diagnosis not present

## 2017-08-16 DIAGNOSIS — I10 Essential (primary) hypertension: Secondary | ICD-10-CM | POA: Diagnosis not present

## 2017-08-16 DIAGNOSIS — Z1389 Encounter for screening for other disorder: Secondary | ICD-10-CM | POA: Diagnosis not present

## 2017-08-16 DIAGNOSIS — M8580 Other specified disorders of bone density and structure, unspecified site: Secondary | ICD-10-CM | POA: Diagnosis not present

## 2017-08-22 DIAGNOSIS — Z1212 Encounter for screening for malignant neoplasm of rectum: Secondary | ICD-10-CM | POA: Diagnosis not present

## 2017-08-31 DIAGNOSIS — R972 Elevated prostate specific antigen [PSA]: Secondary | ICD-10-CM | POA: Diagnosis not present

## 2017-08-31 DIAGNOSIS — R3912 Poor urinary stream: Secondary | ICD-10-CM | POA: Diagnosis not present

## 2017-08-31 DIAGNOSIS — N401 Enlarged prostate with lower urinary tract symptoms: Secondary | ICD-10-CM | POA: Diagnosis not present

## 2018-04-10 DIAGNOSIS — K648 Other hemorrhoids: Secondary | ICD-10-CM | POA: Diagnosis not present

## 2018-04-24 DIAGNOSIS — K625 Hemorrhage of anus and rectum: Secondary | ICD-10-CM | POA: Diagnosis not present

## 2018-04-24 DIAGNOSIS — K648 Other hemorrhoids: Secondary | ICD-10-CM | POA: Diagnosis not present

## 2018-04-29 DIAGNOSIS — Z961 Presence of intraocular lens: Secondary | ICD-10-CM | POA: Diagnosis not present

## 2018-04-29 DIAGNOSIS — H52203 Unspecified astigmatism, bilateral: Secondary | ICD-10-CM | POA: Diagnosis not present

## 2018-05-08 DIAGNOSIS — K648 Other hemorrhoids: Secondary | ICD-10-CM | POA: Diagnosis not present

## 2018-06-20 DIAGNOSIS — D128 Benign neoplasm of rectum: Secondary | ICD-10-CM | POA: Diagnosis not present

## 2018-06-20 DIAGNOSIS — K51211 Ulcerative (chronic) proctitis with rectal bleeding: Secondary | ICD-10-CM | POA: Diagnosis not present

## 2018-06-20 DIAGNOSIS — K648 Other hemorrhoids: Secondary | ICD-10-CM | POA: Diagnosis not present

## 2018-06-20 DIAGNOSIS — Z9089 Acquired absence of other organs: Secondary | ICD-10-CM | POA: Diagnosis not present

## 2018-06-20 DIAGNOSIS — D123 Benign neoplasm of transverse colon: Secondary | ICD-10-CM | POA: Diagnosis not present

## 2018-06-20 DIAGNOSIS — K635 Polyp of colon: Secondary | ICD-10-CM | POA: Diagnosis not present

## 2018-06-20 DIAGNOSIS — Z86010 Personal history of colonic polyps: Secondary | ICD-10-CM | POA: Diagnosis not present

## 2018-06-20 DIAGNOSIS — K621 Rectal polyp: Secondary | ICD-10-CM | POA: Diagnosis not present

## 2018-06-20 DIAGNOSIS — K5730 Diverticulosis of large intestine without perforation or abscess without bleeding: Secondary | ICD-10-CM | POA: Diagnosis not present

## 2018-06-20 DIAGNOSIS — K625 Hemorrhage of anus and rectum: Secondary | ICD-10-CM | POA: Diagnosis not present

## 2018-06-21 DIAGNOSIS — D126 Benign neoplasm of colon, unspecified: Secondary | ICD-10-CM | POA: Diagnosis not present

## 2018-06-25 DIAGNOSIS — Z23 Encounter for immunization: Secondary | ICD-10-CM | POA: Diagnosis not present

## 2018-08-13 DIAGNOSIS — L570 Actinic keratosis: Secondary | ICD-10-CM | POA: Diagnosis not present

## 2018-08-13 DIAGNOSIS — L821 Other seborrheic keratosis: Secondary | ICD-10-CM | POA: Diagnosis not present

## 2018-08-13 DIAGNOSIS — L820 Inflamed seborrheic keratosis: Secondary | ICD-10-CM | POA: Diagnosis not present

## 2018-08-13 DIAGNOSIS — L603 Nail dystrophy: Secondary | ICD-10-CM | POA: Diagnosis not present

## 2018-08-13 DIAGNOSIS — D485 Neoplasm of uncertain behavior of skin: Secondary | ICD-10-CM | POA: Diagnosis not present

## 2018-08-13 DIAGNOSIS — D1801 Hemangioma of skin and subcutaneous tissue: Secondary | ICD-10-CM | POA: Diagnosis not present

## 2018-08-16 DIAGNOSIS — I10 Essential (primary) hypertension: Secondary | ICD-10-CM | POA: Diagnosis not present

## 2018-08-16 DIAGNOSIS — E7849 Other hyperlipidemia: Secondary | ICD-10-CM | POA: Diagnosis not present

## 2018-08-16 DIAGNOSIS — M859 Disorder of bone density and structure, unspecified: Secondary | ICD-10-CM | POA: Diagnosis not present

## 2018-08-16 DIAGNOSIS — R82998 Other abnormal findings in urine: Secondary | ICD-10-CM | POA: Diagnosis not present

## 2018-08-16 DIAGNOSIS — Z125 Encounter for screening for malignant neoplasm of prostate: Secondary | ICD-10-CM | POA: Diagnosis not present

## 2018-08-23 DIAGNOSIS — E7849 Other hyperlipidemia: Secondary | ICD-10-CM | POA: Diagnosis not present

## 2018-08-23 DIAGNOSIS — Z1389 Encounter for screening for other disorder: Secondary | ICD-10-CM | POA: Diagnosis not present

## 2018-08-23 DIAGNOSIS — Z0000 Encounter for general adult medical examination without abnormal findings: Secondary | ICD-10-CM | POA: Diagnosis not present

## 2018-08-23 DIAGNOSIS — Z6824 Body mass index (BMI) 24.0-24.9, adult: Secondary | ICD-10-CM | POA: Diagnosis not present

## 2018-08-23 DIAGNOSIS — H8110 Benign paroxysmal vertigo, unspecified ear: Secondary | ICD-10-CM | POA: Diagnosis not present

## 2018-08-23 DIAGNOSIS — M859 Disorder of bone density and structure, unspecified: Secondary | ICD-10-CM | POA: Diagnosis not present

## 2018-08-23 DIAGNOSIS — M25551 Pain in right hip: Secondary | ICD-10-CM | POA: Diagnosis not present

## 2018-08-23 DIAGNOSIS — R972 Elevated prostate specific antigen [PSA]: Secondary | ICD-10-CM | POA: Diagnosis not present

## 2018-08-23 DIAGNOSIS — N401 Enlarged prostate with lower urinary tract symptoms: Secondary | ICD-10-CM | POA: Diagnosis not present

## 2018-08-23 DIAGNOSIS — M25519 Pain in unspecified shoulder: Secondary | ICD-10-CM | POA: Diagnosis not present

## 2018-08-23 DIAGNOSIS — I10 Essential (primary) hypertension: Secondary | ICD-10-CM | POA: Diagnosis not present

## 2018-08-26 DIAGNOSIS — Z1212 Encounter for screening for malignant neoplasm of rectum: Secondary | ICD-10-CM | POA: Diagnosis not present

## 2018-09-10 DIAGNOSIS — R972 Elevated prostate specific antigen [PSA]: Secondary | ICD-10-CM | POA: Diagnosis not present

## 2018-09-10 DIAGNOSIS — N401 Enlarged prostate with lower urinary tract symptoms: Secondary | ICD-10-CM | POA: Diagnosis not present

## 2018-09-10 DIAGNOSIS — R3912 Poor urinary stream: Secondary | ICD-10-CM | POA: Diagnosis not present

## 2018-09-10 DIAGNOSIS — N5201 Erectile dysfunction due to arterial insufficiency: Secondary | ICD-10-CM | POA: Diagnosis not present

## 2018-09-18 ENCOUNTER — Other Ambulatory Visit: Payer: Self-pay | Admitting: Orthopedic Surgery

## 2018-09-18 DIAGNOSIS — M19011 Primary osteoarthritis, right shoulder: Secondary | ICD-10-CM | POA: Diagnosis not present

## 2018-09-18 DIAGNOSIS — M25511 Pain in right shoulder: Secondary | ICD-10-CM

## 2018-09-18 DIAGNOSIS — M25512 Pain in left shoulder: Secondary | ICD-10-CM | POA: Diagnosis not present

## 2018-09-18 DIAGNOSIS — M25551 Pain in right hip: Secondary | ICD-10-CM | POA: Diagnosis not present

## 2018-09-18 DIAGNOSIS — M19012 Primary osteoarthritis, left shoulder: Secondary | ICD-10-CM | POA: Diagnosis not present

## 2018-09-24 ENCOUNTER — Ambulatory Visit
Admission: RE | Admit: 2018-09-24 | Discharge: 2018-09-24 | Disposition: A | Discharge status: Home / Self Care | Payer: Medicare Other | Source: Ambulatory Visit | Attending: Orthopedic Surgery | Admitting: Orthopedic Surgery

## 2018-09-24 DIAGNOSIS — M25511 Pain in right shoulder: Secondary | ICD-10-CM

## 2018-09-24 DIAGNOSIS — M19011 Primary osteoarthritis, right shoulder: Secondary | ICD-10-CM | POA: Diagnosis not present

## 2018-10-07 ENCOUNTER — Encounter (HOSPITAL_COMMUNITY): Payer: Self-pay | Admitting: *Deleted

## 2018-10-14 DIAGNOSIS — M19011 Primary osteoarthritis, right shoulder: Secondary | ICD-10-CM | POA: Diagnosis not present

## 2018-10-24 NOTE — Patient Instructions (Addendum)
Todd Myers  11/27/1940     Your procedure is scheduled on:  10-31-2018   Report to [facility] Main  Entrance,  Report to admitting at  7:30 AM    Call this number if you have problems the morning of surgery [phone]       Remember: Do not eat food or drink liquids :After Midnight.   BRUSH YOUR TEETH MORNING OF SURGERY AND RINSE YOUR MOUTH OUT, NO CHEWING GUM CANDY OR MINTS.        Take these medicines the morning of surgery with A SIP OF WATER:  NONE                                  You may not have any metal on your body including hair pins and               piercings  Do not wear jewelry, make-up, lotions, powders or perfumes, deodorant                          Men may shave face and neck.      Do not bring valuables to the hospital. [place].  Contacts, dentures or bridgework may not be worn into surgery.  Leave suitcase in the car. After surgery it may be brought to your room.    _____________________________________________________________________    [facility] - Preparing for Surgery Before surgery, you can play an important role.  Because skin is not sterile, your skin needs to be as free of germs as possible.  You can reduce the number of germs on your skin by washing with CHG (chlorahexidine gluconate) soap before surgery.  CHG is an antiseptic cleaner which kills germs and bonds with the skin to continue killing germs even after washing. Please DO NOT use if you have an allergy to CHG or antibacterial soaps.  If your skin becomes reddened/irritated stop using the CHG and inform your nurse when you arrive at Short Stay. Do not shave (including legs and underarms) for at least 48 hours prior to the first CHG shower.  You may shave your face/neck. Please follow these instructions carefully:  1.  Shower with CHG Soap the night before surgery and the  morning of Surgery.  2.   If you choose to wash your hair, wash your hair first as usual with your  normal  shampoo.  3.  After you shampoo, rinse your hair and body thoroughly to remove the  shampoo.                            4.  Use CHG as you would any other liquid soap.  You can apply chg directly  to the skin and wash                       Gently with a scrungie or clean washcloth.  5.  Apply the CHG Soap to your body ONLY FROM THE NECK DOWN.   Do not use on face/ open  Wound or open sores. Avoid contact with eyes, ears mouth and genitals (private parts).                       Wash face,  Genitals (private parts) with your normal soap.             6.  Wash thoroughly, paying special attention to the area where your surgery  will be performed.  7.  Thoroughly rinse your body with warm water from the neck down.  8.  DO NOT shower/wash with your normal soap after using and rinsing off  the CHG Soap.             9.  Pat yourself dry with a clean towel.            10.  Wear clean pajamas.            11.  Place clean sheets on your bed the night of your first shower and do not  sleep with pets. Day of Surgery : Do not apply any lotions/deodorants the morning of surgery.  Please wear clean clothes to the hospital/surgery center.  FAILURE TO FOLLOW THESE INSTRUCTIONS MAY RESULT IN THE CANCELLATION OF YOUR SURGERY PATIENT SIGNATURE_________________________________  NURSE SIGNATURE__________________________________  ________________________________________________________________________   [name]  An incentive spirometer is a tool that can help keep your lungs clear and active. This tool measures how well you are filling your lungs with each breath. Taking long deep breaths may help reverse or decrease the chance of developing breathing (pulmonary) problems (especially infection) following:  A long period of time when you are unable to move or be active. BEFORE THE PROCEDURE    If the spirometer includes an indicator to show your best effort, your nurse or respiratory therapist will set it to a desired goal.  If possible, sit up straight or lean slightly forward. Try not to slouch.  Hold the incentive spirometer in an upright position. INSTRUCTIONS FOR USE  1. Sit on the edge of your bed if possible, or sit up as far as you can in bed or on a chair. 2. Hold the incentive spirometer in an upright position. 3. Breathe out normally. 4. Place the mouthpiece in your mouth and seal your lips tightly around it. 5. Breathe in slowly and as deeply as possible, raising the piston or the ball toward the top of the column. 6. Hold your breath for 3-5 seconds or for as long as possible. Allow the piston or ball to fall to the bottom of the column. 7. Remove the mouthpiece from your mouth and breathe out normally. 8. Rest for a few seconds and repeat Steps 1 through 7 at least 10 times every 1-2 hours when you are awake. Take your time and take a few normal breaths between deep breaths. 9. The spirometer may include an indicator to show your best effort. Use the indicator as a goal to work toward during each repetition. 10. After each set of 10 deep breaths, practice coughing to be sure your lungs are clear. If you have an incision (the cut made at the time of surgery), support your incision when coughing by placing a pillow or rolled up towels firmly against it. Once you are able to get out of bed, walk around indoors and cough well. You may stop using the incentive spirometer when instructed by your caregiver.  RISKS AND COMPLICATIONS  Take your time so you do not get dizzy or light-headed.  If you are in pain, you may need to take or ask for pain medication before doing incentive spirometry. It is harder to take a deep breath if you are having pain. AFTER USE  Rest and breathe slowly and easily.  It can be helpful to keep track of a log of your progress. Your caregiver  can provide you with a simple table to help with this. If you are using the spirometer at home, follow these instructions: [place] IF:   You are having difficultly using the spirometer.  You have trouble using the spirometer as often as instructed.  Your pain medication is not giving enough relief while using the spirometer.  You develop fever of 100.5 F (38.1 C) or higher. SEEK IMMEDIATE MEDICAL CARE IF:   You cough up bloody sputum that had not been present before.  You develop fever of 102 F (38.9 C) or greater.  You develop worsening pain at or near the incision site. MAKE SURE YOU:   Understand these instructions.  Will watch your condition.  Will get help right away if you are not doing well or get worse. Document Released: 01/08/2007 Document Revised: 11/20/2011 Document Reviewed: 03/11/2007 [facility] Patient Information 2014 [place], [place].   ________________________________________________________________________

## 2018-10-25 ENCOUNTER — Other Ambulatory Visit: Payer: Self-pay

## 2018-10-25 ENCOUNTER — Encounter (HOSPITAL_COMMUNITY): Payer: Self-pay

## 2018-10-25 ENCOUNTER — Encounter (HOSPITAL_COMMUNITY)
Admission: RE | Admit: 2018-10-25 | Discharge: 2018-10-25 | Disposition: A | Discharge status: Home / Self Care | Payer: Medicare Other | Source: Ambulatory Visit | Attending: Orthopedic Surgery | Admitting: Orthopedic Surgery

## 2018-10-25 DIAGNOSIS — Z01818 Encounter for other preprocedural examination: Secondary | ICD-10-CM | POA: Insufficient documentation

## 2018-10-25 DIAGNOSIS — R001 Bradycardia, unspecified: Secondary | ICD-10-CM | POA: Diagnosis not present

## 2018-10-25 DIAGNOSIS — M19011 Primary osteoarthritis, right shoulder: Secondary | ICD-10-CM | POA: Insufficient documentation

## 2018-10-25 HISTORY — DX: Presence of spectacles and contact lenses: Z97.3

## 2018-10-25 HISTORY — DX: Gastro-esophageal reflux disease without esophagitis: K21.9

## 2018-10-25 HISTORY — DX: Benign prostatic hyperplasia with lower urinary tract symptoms: N40.1

## 2018-10-25 HISTORY — DX: Unspecified osteoarthritis, unspecified site: M19.90

## 2018-10-25 HISTORY — DX: Presence of external hearing-aid: Z97.4

## 2018-10-25 HISTORY — DX: Nocturia: R35.1

## 2018-10-25 LAB — BASIC METABOLIC PANEL
ANION GAP: 9 (ref 5–15)
BUN: 19 mg/dL (ref 8–23)
CO2: 26 mmol/L (ref 22–32)
Calcium: 9 mg/dL (ref 8.9–10.3)
Chloride: 101 mmol/L (ref 98–111)
Creatinine, Ser: 0.88 mg/dL (ref 0.61–1.24)
GFR calc Af Amer: >60 mL/min (ref >60)
GFR calc non Af Amer: >60 mL/min (ref >60)
Glucose, Bld: 92 mg/dL (ref 70–99)
Potassium: 4.4 mmol/L (ref 3.5–5.1)
Sodium: 136 mmol/L (ref 135–145)

## 2018-10-25 LAB — CBC
HCT: 52.2 % — ABNORMAL HIGH (ref 39.0–52.0)
Hemoglobin: 17.6 g/dL — ABNORMAL HIGH (ref 13.0–17.0)
MCH: 32.7 pg (ref 26.0–34.0)
MCHC: 33.7 g/dL (ref 30.0–36.0)
MCV: 97 fL (ref 80.0–100.0)
PLATELETS: 223 10*3/uL (ref 150–400)
RBC: 5.38 MIL/uL (ref 4.22–5.81)
RDW: 12.3 % (ref 11.5–15.5)
WBC: 7.3 10*3/uL (ref 4.0–10.5)
nRBC: 0 % (ref 0.0–0.2)

## 2018-10-25 LAB — SURGICAL PCR SCREEN
MRSA, PCR: NEGATIVE
Staphylococcus aureus: NEGATIVE

## 2018-10-28 NOTE — Progress Notes (Signed)
Final EKG dated 10-25-2018 in epic.

## 2018-10-31 ENCOUNTER — Encounter (HOSPITAL_COMMUNITY)
Admission: RE | Disposition: A | Discharge status: Home / Self Care | Payer: Self-pay | Source: Home / Self Care | Attending: Orthopedic Surgery

## 2018-10-31 ENCOUNTER — Other Ambulatory Visit: Payer: Self-pay

## 2018-10-31 ENCOUNTER — Inpatient Hospital Stay (HOSPITAL_COMMUNITY)
Admission: RE | Admit: 2018-10-31 | Discharge: 2018-11-01 | DRG: 483 | Disposition: A | Discharge status: Home / Self Care | Payer: Medicare Other | Attending: Orthopedic Surgery | Admitting: Orthopedic Surgery

## 2018-10-31 ENCOUNTER — Inpatient Hospital Stay (HOSPITAL_COMMUNITY): Payer: Medicare Other | Admitting: Anesthesiology

## 2018-10-31 ENCOUNTER — Inpatient Hospital Stay (HOSPITAL_COMMUNITY): Payer: Medicare Other | Admitting: Physician Assistant

## 2018-10-31 ENCOUNTER — Encounter (HOSPITAL_COMMUNITY): Payer: Self-pay | Admitting: *Deleted

## 2018-10-31 DIAGNOSIS — G8918 Other acute postprocedural pain: Secondary | ICD-10-CM | POA: Diagnosis not present

## 2018-10-31 DIAGNOSIS — Z8249 Family history of ischemic heart disease and other diseases of the circulatory system: Secondary | ICD-10-CM

## 2018-10-31 DIAGNOSIS — Z96611 Presence of right artificial shoulder joint: Secondary | ICD-10-CM

## 2018-10-31 DIAGNOSIS — Z974 Presence of external hearing-aid: Secondary | ICD-10-CM | POA: Diagnosis not present

## 2018-10-31 DIAGNOSIS — R351 Nocturia: Secondary | ICD-10-CM | POA: Diagnosis present

## 2018-10-31 DIAGNOSIS — Z961 Presence of intraocular lens: Secondary | ICD-10-CM | POA: Diagnosis present

## 2018-10-31 DIAGNOSIS — Z825 Family history of asthma and other chronic lower respiratory diseases: Secondary | ICD-10-CM

## 2018-10-31 DIAGNOSIS — Z79899 Other long term (current) drug therapy: Secondary | ICD-10-CM

## 2018-10-31 DIAGNOSIS — Z87891 Personal history of nicotine dependence: Secondary | ICD-10-CM

## 2018-10-31 DIAGNOSIS — Z9842 Cataract extraction status, left eye: Secondary | ICD-10-CM

## 2018-10-31 DIAGNOSIS — N401 Enlarged prostate with lower urinary tract symptoms: Secondary | ICD-10-CM | POA: Diagnosis present

## 2018-10-31 DIAGNOSIS — I10 Essential (primary) hypertension: Secondary | ICD-10-CM | POA: Diagnosis present

## 2018-10-31 DIAGNOSIS — Z9841 Cataract extraction status, right eye: Secondary | ICD-10-CM

## 2018-10-31 DIAGNOSIS — Z791 Long term (current) use of non-steroidal anti-inflammatories (NSAID): Secondary | ICD-10-CM | POA: Diagnosis not present

## 2018-10-31 DIAGNOSIS — M19011 Primary osteoarthritis, right shoulder: Principal | ICD-10-CM | POA: Diagnosis present

## 2018-10-31 DIAGNOSIS — K219 Gastro-esophageal reflux disease without esophagitis: Secondary | ICD-10-CM | POA: Diagnosis present

## 2018-10-31 DIAGNOSIS — E785 Hyperlipidemia, unspecified: Secondary | ICD-10-CM | POA: Diagnosis present

## 2018-10-31 HISTORY — PX: REVERSE SHOULDER ARTHROPLASTY: SHX5054

## 2018-10-31 SURGERY — ARTHROPLASTY, SHOULDER, TOTAL, REVERSE
Anesthesia: General | Site: Shoulder | Laterality: Right

## 2018-10-31 MED ORDER — METHOCARBAMOL 500 MG PO TABS: 500.0000 mg | ORAL_TABLET | Freq: Four times a day (QID) | ORAL | Status: DC | PRN

## 2018-10-31 MED ORDER — FENTANYL CITRATE (PF) 100 MCG/2ML IJ SOLN
INTRAMUSCULAR | Status: DC | PRN
  Administered 2018-10-31 (×2): 50 ug via INTRAVENOUS

## 2018-10-31 MED ORDER — ROCURONIUM BROMIDE 10 MG/ML (PF) SYRINGE
PREFILLED_SYRINGE | INTRAVENOUS | Status: DC | PRN
  Administered 2018-10-31: 10 mg via INTRAVENOUS
  Administered 2018-10-31: 50 mg via INTRAVENOUS

## 2018-10-31 MED ORDER — IRBESARTAN 150 MG PO TABS
150.0000 mg | ORAL_TABLET | Freq: Every day | ORAL | Status: DC
  Administered 2018-10-31: 150 mg via ORAL
  Filled 2018-10-31 (×2): qty 1

## 2018-10-31 MED ORDER — ONDANSETRON HCL 4 MG PO TABS: 4.0000 mg | ORAL_TABLET | Freq: Four times a day (QID) | ORAL | Status: DC | PRN

## 2018-10-31 MED ORDER — FENTANYL CITRATE (PF) 100 MCG/2ML IJ SOLN
50.0000 ug | Freq: Once | INTRAMUSCULAR | Status: DC
  Filled 2018-10-31: qty 2

## 2018-10-31 MED ORDER — ONDANSETRON HCL 4 MG/2ML IJ SOLN
INTRAMUSCULAR | Status: AC
  Filled 2018-10-31: qty 2

## 2018-10-31 MED ORDER — TRANEXAMIC ACID-NACL 1000-0.7 MG/100ML-% IV SOLN
1000.0000 mg | INTRAVENOUS | Status: AC
  Administered 2018-10-31: 1000 mg via INTRAVENOUS
  Filled 2018-10-31: qty 100

## 2018-10-31 MED ORDER — ONDANSETRON HCL 4 MG/2ML IJ SOLN
INTRAMUSCULAR | Status: DC | PRN
  Administered 2018-10-31: 4 mg via INTRAVENOUS

## 2018-10-31 MED ORDER — PANTOPRAZOLE SODIUM 40 MG PO TBEC
40.0000 mg | DELAYED_RELEASE_TABLET | Freq: Every day | ORAL | Status: DC
  Administered 2018-11-01: 40 mg via ORAL
  Filled 2018-10-31: qty 1

## 2018-10-31 MED ORDER — ACETAMINOPHEN 160 MG/5ML PO SOLN: 325.0000 mg | ORAL | Status: DC | PRN

## 2018-10-31 MED ORDER — OXYCODONE HCL 5 MG PO TABS: 5.0000 mg | ORAL_TABLET | Freq: Once | ORAL | Status: DC | PRN

## 2018-10-31 MED ORDER — DOCUSATE SODIUM 100 MG PO CAPS
100.0000 mg | ORAL_CAPSULE | Freq: Two times a day (BID) | ORAL | Status: DC
  Administered 2018-10-31 – 2018-11-01 (×2): 100 mg via ORAL
  Filled 2018-10-31 (×2): qty 1

## 2018-10-31 MED ORDER — MIDAZOLAM HCL 2 MG/2ML IJ SOLN
2.0000 mg | Freq: Once | INTRAMUSCULAR | Status: AC
  Administered 2018-10-31: 1 mg via INTRAVENOUS

## 2018-10-31 MED ORDER — OXYCODONE HCL 5 MG PO TABS: 10.0000 mg | ORAL_TABLET | ORAL | Status: DC | PRN

## 2018-10-31 MED ORDER — PROPOFOL 10 MG/ML IV BOLUS
INTRAVENOUS | Status: AC
  Filled 2018-10-31: qty 20

## 2018-10-31 MED ORDER — TRAMADOL HCL 50 MG PO TABS: 50.0000 mg | ORAL_TABLET | Freq: Four times a day (QID) | ORAL | Status: DC | PRN

## 2018-10-31 MED ORDER — ALUM & MAG HYDROXIDE-SIMETH 200-200-20 MG/5ML PO SUSP: 30.0000 mL | ORAL | Status: DC | PRN

## 2018-10-31 MED ORDER — LACTATED RINGERS IV SOLN
INTRAVENOUS | Status: DC | PRN
  Administered 2018-10-31 (×2): via INTRAVENOUS

## 2018-10-31 MED ORDER — 0.9 % SODIUM CHLORIDE (POUR BTL) OPTIME
TOPICAL | Status: DC | PRN
  Administered 2018-10-31: 1000 mL

## 2018-10-31 MED ORDER — BUPIVACAINE LIPOSOME 1.3 % IJ SUSP
INTRAMUSCULAR | Status: DC | PRN
  Administered 2018-10-31: 10 mL via PERINEURAL

## 2018-10-31 MED ORDER — METHOCARBAMOL 500 MG IVPB - SIMPLE MED
500.0000 mg | Freq: Four times a day (QID) | INTRAVENOUS | Status: DC | PRN
  Filled 2018-10-31: qty 50

## 2018-10-31 MED ORDER — SODIUM CHLORIDE 0.9 % IV SOLN
INTRAVENOUS | Status: DC | PRN
  Administered 2018-10-31: 15 ug/min via INTRAVENOUS

## 2018-10-31 MED ORDER — ACETAMINOPHEN 325 MG PO TABS: 325.0000 mg | ORAL_TABLET | ORAL | Status: DC | PRN

## 2018-10-31 MED ORDER — MIDAZOLAM HCL 2 MG/2ML IJ SOLN
INTRAMUSCULAR | Status: AC
  Administered 2018-10-31: 1 mg
  Filled 2018-10-31: qty 2

## 2018-10-31 MED ORDER — METOCLOPRAMIDE HCL 5 MG/ML IJ SOLN: 5.0000 mg | Freq: Three times a day (TID) | INTRAMUSCULAR | Status: DC | PRN

## 2018-10-31 MED ORDER — TEMAZEPAM 15 MG PO CAPS: 15.0000 mg | ORAL_CAPSULE | Freq: Every evening | ORAL | Status: DC | PRN

## 2018-10-31 MED ORDER — ROCURONIUM BROMIDE 100 MG/10ML IV SOLN
INTRAVENOUS | Status: AC
  Filled 2018-10-31: qty 1

## 2018-10-31 MED ORDER — OXYCODONE HCL 5 MG PO TABS
5.0000 mg | ORAL_TABLET | ORAL | Status: DC | PRN
  Administered 2018-10-31: 5 mg via ORAL
  Filled 2018-10-31: qty 1

## 2018-10-31 MED ORDER — EPHEDRINE 5 MG/ML INJ
INTRAVENOUS | Status: AC
  Filled 2018-10-31: qty 10

## 2018-10-31 MED ORDER — PROPOFOL 10 MG/ML IV BOLUS
INTRAVENOUS | Status: DC | PRN
  Administered 2018-10-31: 150 mg via INTRAVENOUS

## 2018-10-31 MED ORDER — BISACODYL 5 MG PO TBEC: 5.0000 mg | DELAYED_RELEASE_TABLET | Freq: Every day | ORAL | Status: DC | PRN

## 2018-10-31 MED ORDER — FENTANYL CITRATE (PF) 100 MCG/2ML IJ SOLN
INTRAMUSCULAR | Status: AC
  Filled 2018-10-31: qty 2

## 2018-10-31 MED ORDER — METOCLOPRAMIDE HCL 5 MG PO TABS: 5.0000 mg | ORAL_TABLET | Freq: Three times a day (TID) | ORAL | Status: DC | PRN

## 2018-10-31 MED ORDER — LIDOCAINE 2% (20 MG/ML) 5 ML SYRINGE
INTRAMUSCULAR | Status: AC
  Filled 2018-10-31: qty 5

## 2018-10-31 MED ORDER — OXYCODONE HCL 5 MG/5ML PO SOLN: 5.0000 mg | Freq: Once | ORAL | Status: DC | PRN

## 2018-10-31 MED ORDER — LACTATED RINGERS IV SOLN: INTRAVENOUS | Status: DC

## 2018-10-31 MED ORDER — SUGAMMADEX SODIUM 200 MG/2ML IV SOLN
INTRAVENOUS | Status: DC | PRN
  Administered 2018-10-31: 200 mg via INTRAVENOUS

## 2018-10-31 MED ORDER — KETOROLAC TROMETHAMINE 15 MG/ML IJ SOLN
7.5000 mg | Freq: Four times a day (QID) | INTRAMUSCULAR | Status: AC
  Administered 2018-10-31 – 2018-11-01 (×4): 7.5 mg via INTRAVENOUS
  Filled 2018-10-31 (×4): qty 1

## 2018-10-31 MED ORDER — ACETAMINOPHEN 325 MG PO TABS
325.0000 mg | ORAL_TABLET | Freq: Four times a day (QID) | ORAL | Status: DC | PRN
  Administered 2018-10-31: 650 mg via ORAL
  Filled 2018-10-31: qty 2

## 2018-10-31 MED ORDER — GLYCOPYRROLATE PF 0.2 MG/ML IJ SOSY
PREFILLED_SYRINGE | INTRAMUSCULAR | Status: DC | PRN
  Administered 2018-10-31 (×2): .1 mg via INTRAVENOUS

## 2018-10-31 MED ORDER — MAGNESIUM CITRATE PO SOLN: 1.0000 | Freq: Once | ORAL | Status: DC | PRN

## 2018-10-31 MED ORDER — HYDROMORPHONE HCL 1 MG/ML IJ SOLN: 0.5000 mg | INTRAMUSCULAR | Status: DC | PRN

## 2018-10-31 MED ORDER — MEPERIDINE HCL 50 MG/ML IJ SOLN: 6.2500 mg | INTRAMUSCULAR | Status: DC | PRN

## 2018-10-31 MED ORDER — DEXAMETHASONE SODIUM PHOSPHATE 10 MG/ML IJ SOLN
INTRAMUSCULAR | Status: AC
  Filled 2018-10-31: qty 1

## 2018-10-31 MED ORDER — DEXAMETHASONE SODIUM PHOSPHATE 10 MG/ML IJ SOLN
INTRAMUSCULAR | Status: DC | PRN
  Administered 2018-10-31: 10 mg via INTRAVENOUS

## 2018-10-31 MED ORDER — ONDANSETRON HCL 4 MG/2ML IJ SOLN: 4.0000 mg | Freq: Once | INTRAMUSCULAR | Status: DC | PRN

## 2018-10-31 MED ORDER — EPHEDRINE SULFATE-NACL 50-0.9 MG/10ML-% IV SOSY
PREFILLED_SYRINGE | INTRAVENOUS | Status: DC | PRN
  Administered 2018-10-31 (×5): 5 mg via INTRAVENOUS

## 2018-10-31 MED ORDER — CHLORHEXIDINE GLUCONATE 4 % EX LIQD: 60.0000 mL | Freq: Once | CUTANEOUS | Status: DC

## 2018-10-31 MED ORDER — PHENOL 1.4 % MT LIQD: 1.0000 | OROMUCOSAL | Status: DC | PRN

## 2018-10-31 MED ORDER — LACTATED RINGERS IV SOLN
INTRAVENOUS | Status: DC
  Administered 2018-10-31: 08:00:00 via INTRAVENOUS

## 2018-10-31 MED ORDER — MENTHOL 3 MG MT LOZG: 1.0000 | LOZENGE | OROMUCOSAL | Status: DC | PRN

## 2018-10-31 MED ORDER — FENTANYL CITRATE (PF) 100 MCG/2ML IJ SOLN: 25.0000 ug | INTRAMUSCULAR | Status: DC | PRN

## 2018-10-31 MED ORDER — POLYETHYLENE GLYCOL 3350 17 G PO PACK: 17.0000 g | PACK | Freq: Every day | ORAL | Status: DC | PRN

## 2018-10-31 MED ORDER — ONDANSETRON HCL 4 MG/2ML IJ SOLN: 4.0000 mg | Freq: Four times a day (QID) | INTRAMUSCULAR | Status: DC | PRN

## 2018-10-31 MED ORDER — DIPHENHYDRAMINE HCL 12.5 MG/5ML PO ELIX: 12.5000 mg | ORAL_SOLUTION | ORAL | Status: DC | PRN

## 2018-10-31 MED ORDER — LIDOCAINE 2% (20 MG/ML) 5 ML SYRINGE
INTRAMUSCULAR | Status: DC | PRN
  Administered 2018-10-31: 100 mg via INTRAVENOUS

## 2018-10-31 MED ORDER — BUPIVACAINE-EPINEPHRINE (PF) 0.5% -1:200000 IJ SOLN
INTRAMUSCULAR | Status: DC | PRN
  Administered 2018-10-31: 20 mL via PERINEURAL

## 2018-10-31 MED ORDER — STERILE WATER FOR IRRIGATION IR SOLN
Status: DC | PRN
  Administered 2018-10-31: 2000 mL

## 2018-10-31 MED ORDER — CEFAZOLIN SODIUM-DEXTROSE 2-4 GM/100ML-% IV SOLN
2.0000 g | INTRAVENOUS | Status: AC
  Administered 2018-10-31: 2 g via INTRAVENOUS
  Filled 2018-10-31: qty 100

## 2018-10-31 SURGICAL SUPPLY — 57 items
BAG ZIPLOCK 12X15 (MISCELLANEOUS) ×3 IMPLANT
BLADE SAW SGTL 83.5X18.5 (BLADE) ×3 IMPLANT
COVER SURGICAL LIGHT HANDLE (MISCELLANEOUS) ×3 IMPLANT
COVER WAND RF STERILE (DRAPES) IMPLANT
CUP SUT UNIV REVERS 39 NEU (Shoulder) ×3 IMPLANT
DERMABOND ADVANCED (GAUZE/BANDAGES/DRESSINGS) ×2
DERMABOND ADVANCED .7 DNX12 (GAUZE/BANDAGES/DRESSINGS) ×1 IMPLANT
DRAPE INCISE IOBAN 66X45 STRL (DRAPES) IMPLANT
DRAPE ORTHO SPLIT 77X108 STRL (DRAPES) ×4
DRAPE SURG 17X11 SM STRL (DRAPES) ×3 IMPLANT
DRAPE SURG ORHT 6 SPLT 77X108 (DRAPES) ×2 IMPLANT
DRAPE U-SHAPE 47X51 STRL (DRAPES) ×3 IMPLANT
DRSG AQUACEL AG ADV 3.5X10 (GAUZE/BANDAGES/DRESSINGS) ×3 IMPLANT
DURAPREP 26ML APPLICATOR (WOUND CARE) ×3 IMPLANT
ELECT BLADE TIP CTD 4 INCH (ELECTRODE) ×3 IMPLANT
ELECT REM PT RETURN 15FT ADLT (MISCELLANEOUS) ×3 IMPLANT
FACESHIELD WRAPAROUND (MASK) ×9 IMPLANT
GLENOID UNI REV MOD 24 +2 LAT (Joint) ×3 IMPLANT
GLENOSPHERE 39+4 LAT/24 UNI RV (Joint) ×3 IMPLANT
GLOVE BIO SURGEON STRL SZ7.5 (GLOVE) ×3 IMPLANT
GLOVE BIO SURGEON STRL SZ8 (GLOVE) ×3 IMPLANT
GLOVE SS BIOGEL STRL SZ 7 (GLOVE) ×1 IMPLANT
GLOVE SS BIOGEL STRL SZ 7.5 (GLOVE) ×1 IMPLANT
GLOVE SUPERSENSE BIOGEL SZ 7 (GLOVE) ×2
GLOVE SUPERSENSE BIOGEL SZ 7.5 (GLOVE) ×2
GOWN STRL REUS W/TWL LRG LVL3 (GOWN DISPOSABLE) ×6 IMPLANT
GUIDE MODEL GLENOID VIP 5D SHO (MISCELLANEOUS) ×3 IMPLANT
INSERT HUMERAL MED 39/ +3 (Shoulder) ×1 IMPLANT
INSERT MEDIUM HUMERAL 39/ +3 (Shoulder) ×2 IMPLANT
KIT BASIN OR (CUSTOM PROCEDURE TRAY) ×3 IMPLANT
MANIFOLD NEPTUNE II (INSTRUMENTS) ×3 IMPLANT
NEEDLE TAPERED W/ NITINOL LOOP (MISCELLANEOUS) IMPLANT
NS IRRIG 1000ML POUR BTL (IV SOLUTION) ×3 IMPLANT
PACK SHOULDER (CUSTOM PROCEDURE TRAY) ×3 IMPLANT
PAD ARMBOARD 7.5X6 YLW CONV (MISCELLANEOUS) ×6 IMPLANT
PIN SET MODULAR GLENOID SYSTEM (PIN) ×3 IMPLANT
PROTECTOR NERVE ULNAR (MISCELLANEOUS) ×3 IMPLANT
RESTRAINT HEAD UNIVERSAL NS (MISCELLANEOUS) ×3 IMPLANT
SCREW CENTRAL MODULAR 25 (Screw) ×3 IMPLANT
SCREW PERI LOCK 5.5X16 (Screw) ×6 IMPLANT
SCREW PERI LOCK 5.5X36 (Screw) ×3 IMPLANT
SCREW PERIPHERAL 5.5X28 LOCK (Screw) ×3 IMPLANT
SLING ARM FOAM STRAP LRG (SOFTGOODS) ×3 IMPLANT
SPACER REVERSE UNI 39/ +6MM (Shoulder) ×1 IMPLANT
SPACER SHLD UNI REV 39 +6 (Shoulder) ×2 IMPLANT
SPONGE LAP 18X18 RF (DISPOSABLE) IMPLANT
STEM HUMERAL UNI REVERS SZ9 (Stem) ×3 IMPLANT
SUCTION FRAZIER HANDLE 12FR (TUBING) ×2
SUCTION TUBE FRAZIER 12FR DISP (TUBING) ×1 IMPLANT
SUT MNCRL AB 3-0 PS2 18 (SUTURE) ×3 IMPLANT
SUT MON AB 2-0 CT1 36 (SUTURE) ×3 IMPLANT
SUT VIC AB 1 CT1 36 (SUTURE) ×3 IMPLANT
SUTURE TAPE 1.3 40 TPR END (SUTURE) ×2 IMPLANT
SUTURETAPE 1.3 40 TPR END (SUTURE) ×6
TOWEL OR 17X26 10 PK STRL BLUE (TOWEL DISPOSABLE) ×6 IMPLANT
TOWEL OR NON WOVEN STRL DISP B (DISPOSABLE) ×3 IMPLANT
WATER STERILE IRR 1000ML POUR (IV SOLUTION) ×6 IMPLANT

## 2018-10-31 NOTE — Anesthesia Procedure Notes (Signed)
Anesthesia Regional Block: Interscalene brachial plexus block   Pre-Anesthetic Checklist: ,, timeout performed, Correct Patient, Correct Site, Correct Laterality, Correct Procedure, Correct Position, site marked, Risks and benefits discussed,  Surgical consent,  Pre-op evaluation,  At surgeon's request and post-op pain management  Laterality: Right  Prep: chloraprep       Needles:  Injection technique: Single-shot  Needle Type: Echogenic Stimulator Needle     Needle Length: 5cm  Needle Gauge: 22     Additional Needles:   Procedures:, nerve stimulator,,, ultrasound used (permanent image in chart),,,,   Nerve Stimulator or Paresthesia:  Response: dlt, 0.45 mA,   Additional Responses:   Narrative:  Start time: 10/31/2018 9:20 AM End time: 10/31/2018 9:25 AM Injection made incrementally with aspirations every 5 mL.  Performed by: Personally  Anesthesiologist: Janeece Riggers, MD  Additional Notes: Functioning IV was confirmed and monitors were applied.  A 5mm 22ga Arrow echogenic stimulator needle was used. Sterile prep and drape,hand hygiene and sterile gloves were used. Ultrasound guidance: relevant anatomy identified, needle position confirmed, local anesthetic spread visualized around nerve(s)., vascular puncture avoided.  Image printed for medical record. Negative aspiration and negative test dose prior to incremental administration of local anesthetic. The patient tolerated the procedure well.

## 2018-10-31 NOTE — Transfer of Care (Signed)
Immediate Anesthesia Transfer of Care Note  Patient: Todd Myers  Procedure(s) Performed: Procedure(s) with comments: RIGHT REVERSE SHOULDER ARTHROPLASTY (Right) - 120 minutes  Patient Location: PACU  Anesthesia Type:GA combined with regional for post-op pain  Level of Consciousness:  sedated, patient cooperative and responds to stimulation  Airway & Oxygen Therapy:Patient Spontanous Breathing and Patient connected to face mask oxgen  Post-op Assessment:  Report given to PACU RN and Post -op Vital signs reviewed and stable  Post vital signs:  Reviewed and stable  Last Vitals:  Vitals:   10/31/18 0940 10/31/18 0945  BP:    Pulse: 71 73  Resp: 11   Temp:    SpO2: 16% 60%    Complications: No apparent anesthesia complications

## 2018-10-31 NOTE — Op Note (Signed)
10/31/2018  11:41 AM  PATIENT:   Todd Myers  78 y.o. male  PRE-OPERATIVE DIAGNOSIS:  Right shoulder osteoarthritis with associated severe rotator cuff dysfunction  POST-OPERATIVE DIAGNOSIS: Same  PROCEDURE: Right shoulder reverse arthroplasty utilizing a press-fit size 9 Arthrex stem, +6 spacer, +3 polyethylene insert, small/+2 baseplate with a 98/+9 glenosphere  SURGEON:  Ora Bollig, Metta Clines M.D.  ASSISTANTS: Jenetta Loges, PA-C  ANESTHESIA:   General endotracheal and interscalene block with Exparel  EBL: 100 cc  SPECIMEN: None  Drains: None   PATIENT DISPOSITION:  PACU - hemodynamically stable.    PLAN OF CARE: Admit for overnight observation  Brief history:  Mr. Dottavio has been followed for chronic and progressively increasing right shoulder pain related to end-stage osteoarthritis with preoperative MRI scan confirming significant rotator cuff degeneration.  He is brought to the operating room at this time for planned right shoulder reverse arthroplasty  Preoperatively and counseled the patient regarding treatment options as well as the potential risks versus benefits thereof.  Possible surgical complications were reviewed including the potential for bleeding, infection, neurovascular injury, persistence of pain, anesthetic complication, failure of the implant, and possible need for additional surgery.  He understands and accepts and agrees with our planned procedure.  Procedure in detail:  After undergoing routine preop evaluation patient received prophylactic antibiotics and an interscalene block with Exparel was established in the holding area by the anesthesia department.  Placed supine on the operative able underwent smooth induction of a general endotracheal anesthesia.  Placed into the beachchair position and appropriately padded and protected.  The right shoulder girdle region was sterilely prepped and draped in standard fashion.  Timeout was called.  An anterior  deltopectoral approach to the right shoulder is made through an 8 cm incision.  Skin flaps are elevated dissection carried deeply in the interval was then developed from proximal to distal with a vein taken laterally.  Upper centimeter the pectoralis major was tenotomized to enhance exposure.  Long head biceps tendon was then unroofed and tenodesed at the upper border the pectoralis major and the proximal segment was then unroofed and excised.  The subscapularis was then elevated off the lesser tuberosity and the free margin was tagged with a pair of grasping suture tape sutures.  Capsular attachments were then divided from the anterior and inferior margins of the humeral neck and humeral head was then delivered through the wound.  Extra medullary guide was then used to outline our proposed humeral head resection which was then performed with an oscillating saw.  Osteophytes on the margin of the humeral neck were then removed with a rondure.  Metal cap placed over the cut proximal humeral surface and we then exposed the glenoid with appropriate retractors gaining complete visualization.  A circumferential labral resection was then performed gaining complete visualization of the glenoid.  We then utilized our VIP guide to place our guidepin into the center of the glenoid based on our preoperative CT scan and template.  We then performed both central and peripheral reaming and then prepared the central screw hole with lag.  Our baseplate was then assembled and introduced into the glenoid with excellent fit and fixation and bony purchase.  The peripheral locking screws were all then placed all of which obtained good purchase.  We then applied the 39/+4 glenosphere onto the baseplate and the central locking screw was then placed.  We then returned our attention to the humeral shaft where the canal was prepared and we broached up to  a size 9.  We used the central reaming guide to prepare the metaphysis the humerus.  A  trial was then placed and this showed good soft tissue balance and good position.  Our final implant was then assembled and impacted into the canal with excellent fit and fixation.  We then performed a series of trial reductions and ultimately felt that a total of 9 mm off the implant was appropriate for soft tissue balance so a +6 spacer and a +3 poly-was then applied and a final reduction was then performed.  We are very pleased with the overall stability and soft tissue balance.  The wound was then copiously irrigated.  Hemostasis was obtained.  The subscapularis was then repaired back to the collar of our implant using the suture tape sutures.  The deltopectoral interval was then reapproximated with a series of figure-of-eight #1 Vicryl sutures.  2-0 Vicryl used for the subcu layer and a intra-cuticular 3-0 Monocryl used for the skin followed by Dermabond and then an Aquasol dressing.  Right arm was then placed in a sling and the ice pack was then placed about the shoulder and the patient was awakened, extubated, and taken to the recovery room in stable condition.  Jenetta Loges, PA-C was used as an Environmental consultant throughout this case essential for help with his itching of the patient, positioning of the extremity, tissue manipulation, implantation of the prosthesis, wound closure, and intraoperative decision-making.  Metta Clines Blessyn [place] MD   Contact # [phone]

## 2018-10-31 NOTE — Anesthesia Preprocedure Evaluation (Signed)
Anesthesia Evaluation  Patient identified by MRN, date of birth, ID band Patient awake    Reviewed: Allergy & Precautions, H&P , NPO status , Patient's Chart, lab work & pertinent test results, reviewed documented beta blocker date and time   Airway Mallampati: II  TM Distance: >3 FB Neck ROM: full    Dental no notable dental hx. (+) Teeth Intact   Pulmonary neg pulmonary ROS, former smoker,    Pulmonary exam normal breath sounds clear to auscultation       Cardiovascular Exercise Tolerance: Good hypertension, Pt. on medications  Rhythm:regular Rate:Normal     Neuro/Psych negative neurological ROS  negative psych ROS   GI/Hepatic Neg liver ROS, GERD  ,  Endo/Other  negative endocrine ROS  Renal/GU negative Renal ROS  negative genitourinary   Musculoskeletal  (+) Arthritis , Osteoarthritis,    Abdominal   Peds  Hematology negative hematology ROS (+)   Anesthesia Other Findings   Reproductive/Obstetrics negative OB ROS                             Anesthesia Physical Anesthesia Plan  ASA: II  Anesthesia Plan: General   Post-op Pain Management: GA combined w/ Regional for post-op pain   Induction:   PONV Risk Score and Plan: 2 and Treatment may vary due to age or medical condition and Ondansetron  Airway Management Planned: Oral ETT  Additional Equipment:   Intra-op Plan:   Post-operative Plan: Extubation in OR  Informed Consent: I have reviewed the patients History and Physical, chart, labs and discussed the procedure including the risks, benefits and alternatives for the proposed anesthesia with the patient or authorized representative who has indicated his/her understanding and acceptance.     Dental Advisory Given  Plan Discussed with: CRNA, Anesthesiologist and Surgeon  Anesthesia Plan Comments:         Anesthesia Quick Evaluation

## 2018-10-31 NOTE — Anesthesia Postprocedure Evaluation (Signed)
Anesthesia Post Note  Patient: Todd Myers  Procedure(s) Performed: RIGHT REVERSE SHOULDER ARTHROPLASTY (Right Shoulder)     Anesthesia Type: General    Last Vitals:  Vitals:   10/31/18 1508 10/31/18 1603  BP: 134/82 121/78  Pulse: 82 77  Resp:    Temp: (!) 36.3 C (!) 36.4 C  SpO2: 99% 97%    Last Pain:  Vitals:   10/31/18 1603  TempSrc: Oral  PainSc: 4                  Tommy Goostree

## 2018-10-31 NOTE — Anesthesia Preprocedure Evaluation (Deleted)
Anesthesia Evaluation    Airway       Dental   Pulmonary former smoker,          Cardiovascular hypertension,     Neuro/Psych    GI/Hepatic   Endo/Other    Renal/GU      Musculoskeletal   Abdominal   Peds  Hematology   Anesthesia Other Findings   Reproductive/Obstetrics                          Anesthesia Physical Anesthesia Plan Anesthesia Quick Evaluation  

## 2018-10-31 NOTE — Discharge Instructions (Signed)
° °Kevin M. Supple, M.D., F.A.A.O.S. °Orthopaedic Surgery °Specializing in Arthroscopic and Reconstructive °Surgery of the Shoulder °[phone] °[address] - [place], [place] [address] - Fax [phone] ° ° °POST-OP TOTAL SHOULDER REPLACEMENT INSTRUCTIONS ° °1. Call the office at [phone] to schedule your first post-op appointment 10-14 days from the date of your surgery. ° °2. The bandage over your incision is waterproof. You may begin showering with this dressing on. You may leave this dressing on until first follow up appointment within 2 weeks. We prefer you leave this dressing in place until follow up however after 5-7 days if you are having itching or skin irritation and would like to remove it you may do so. Go slow and tug at the borders gently to break the bond the dressing has with the skin. At this point if there is no drainage it is okay to go without a bandage or you may cover it with a light guaze and tape. You can also expect significant bruising around your shoulder that will drift down your arm and into your chest wall. This is very normal and should resolve over several days. ° ° 3. Wear your sling/immobilizer at all times except to perform the exercises below or to occasionally let your arm dangle by your side to stretch your elbow. You also need to sleep in your sling immobilizer until instructed otherwise. It is ok to remove your sling if you are sitting in a controlled environment and allow your arm to rest in a position of comfort by your side or on your lap with pillows to give your neck and skin a break from the sling. You may remove it to allow arm to dangle by side to shower. If you are up walking around and when you go to sleep at night you need to wear it. ° °4. Range of motion to your elbow, wrist, and hand are encouraged 3-5 times daily. Exercise to your hand and fingers helps to reduce swelling you may experience. ° °5. Utilize ice to the shoulder 3-5 times  minimum a day and additionally if you are experiencing pain. ° °6. Prescriptions for a pain medication and a muscle relaxant are provided for you. It is recommended that if you are experiencing pain that you pain medication alone is not controlling, add the muscle relaxant along with the pain medication which can give additional pain relief. The first 1-2 days is generally the most severe of your pain and then should gradually decrease. As your pain lessens it is recommended that you decrease your use of the pain medications to an "as needed basis'" only and to always comply with the recommended dosages of the pain medications. ° °7. Pain medications can produce constipation along with their use. If you experience this, the use of an over the counter stool softener or laxative daily is recommended.  ° °8. For additional questions or concerns, please do not hesitate to call the office. If after hours there is an answering service to forward your concerns to the physician on call. ° °9.Pain control following an exparel block ° °To help control your post-operative pain you received a nerve block  performed with Exparel which is a long acting anesthetic (numbing agent) which can provide pain relief and sensations of numbness (and relief of pain) in the operative shoulder and arm for up to 3 days. Sometimes it provides mixed relief, meaning you may still have numbness in certain areas of the arm but can still   be able to move  parts of that arm, hand, and fingers. We recommend that your prescribed pain medications  be used as needed. We do not feel it is necessary to "pre medicate" and "stay ahead" of pain.  Taking narcotic pain medications when you are not having any pain can lead to unnecessary and potentially dangerous side effects.   ° °POST-OP EXERCISES ° °Pendulum Exercises ° °Perform pendulum exercises while standing and bending at the waist. Support your uninvolved arm on a table or chair and allow your operated  arm to hang freely. Make sure to do these exercises passively - not using you shoulder muscles. ° °Repeat 20 times. Do 3 sessions per day. ° ° ° ° °

## 2018-10-31 NOTE — H&P (Signed)
Vesta Mixer    Chief Complaint: Right shoulder osteoarthritis HPI: The patient is a 78 y.o. male with end-stage right shoulder arthritis and associated significant rotator cuff dysfunction and degeneration.  Past Medical History:  Diagnosis Date  . BPH associated with nocturia    w/ hx elevated psa , negative biopsy's  . Dyslipidemia   . GERD (gastroesophageal reflux disease)   . HTN (hypertension)   . OA (osteoarthritis)    right shoulder and left shoulder  . Wears glasses   . Wears hearing aid in both ears     Past Surgical History:  Procedure Laterality Date  . APPENDECTOMY  1980s  . CATARACT EXTRACTION W/ INTRAOCULAR LENS  IMPLANT, BILATERAL  left 2017;  right 2016  . INGUINAL HERNIA REPAIR  x2  right  and x2  left in the 1980s  . ROTATOR CUFF REPAIR Right 2009  . TYMPANOPLASTY Right 1976    Family History  Problem Relation Age of Onset  . COPD Mother   . Heart disease Father     Social History:  reports that he quit smoking about 27 years ago. His smoking use included cigarettes. He quit after 30.00 years of use. He has never used smokeless tobacco. He reports current alcohol use. He reports that he does not use drugs.   Medications Prior to Admission  Medication Sig Dispense Refill  . Cholecalciferol 25 MCG (1000 UT) tablet Take 1,000 Units by mouth daily.     . irbesartan (AVAPRO) 150 MG tablet Take 150 mg by mouth daily.     . meloxicam (MOBIC) 15 MG tablet Take 15 mg by mouth daily as needed for pain.    Vladimir Faster Glycol-Propyl Glycol (SYSTANE OP) Place 1 drop into both eyes daily as needed (dry eyes).    . simvastatin (ZOCOR) 20 MG tablet Take 20 mg by mouth every evening.     . Ca Carbonate-Mag Hydroxide (ROLAIDS PO) Take by mouth as needed.    . naproxen (NAPROSYN) 500 MG tablet Take 1 tablet (500 mg total) by mouth 2 (two) times daily with a meal. (Patient not taking: Reported on 10/18/2018) 40 tablet 1     Physical Exam: Right shoulder demonstrates  painful and guarded motion as noted at recent office visits.  Plain radiographs confirm complete obliteration of the joint space, with MRI confirming significant rotator cuff degeneration and damage  Vitals  Temp:  [97.8 F (36.6 C)] 97.8 F (36.6 C) (02/20 0757) Pulse Rate:  [69] 69 (02/20 0757) Resp:  [18] 18 (02/20 0757) BP: (111)/(75) 111/75 (02/20 0757) SpO2:  [98 %] 98 % (02/20 0757) Weight:  [61.5 kg] 61.5 kg (02/20 0800)  Assessment/Plan  Impression: Right shoulder osteoarthritis  Plan of Action: Procedure(s): RIGHT REVERSE SHOULDER ARTHROPLASTY  Dia Jefferys M Sheketa Ende 10/31/2018, 9:14 AM Contact # [phone]

## 2018-10-31 NOTE — Progress Notes (Signed)
Assisted Dr. Ambrose Pancoast with right, ultrasound guided, interscalene brachial plexus block. Monitors on throughout procedure. See vital signs in flow sheet. Tolerated Procedure well.

## 2018-10-31 NOTE — Anesthesia Procedure Notes (Signed)
Procedure Name: Intubation Date/Time: 10/31/2018 10:15 AM Performed by: Lavina Hamman, CRNA Pre-anesthesia Checklist: Patient identified, Emergency Drugs available, Suction available, Patient being monitored and Timeout performed Patient Re-evaluated:Patient Re-evaluated prior to induction Oxygen Delivery Method: Circle system utilized Preoxygenation: Pre-oxygenation with 100% oxygen Induction Type: IV induction Ventilation: Mask ventilation without difficulty Laryngoscope Size: Mac and 3 Grade View: Grade II Tube type: Oral Tube size: 7.0 mm Number of attempts: 1 Airway Equipment and Method: Stylet Placement Confirmation: ETT inserted through vocal cords under direct vision,  positive ETCO2,  CO2 detector and breath sounds checked- equal and bilateral Secured at: 22 cm Tube secured with: Tape Dental Injury: Teeth and Oropharynx as per pre-operative assessment

## 2018-11-01 ENCOUNTER — Encounter (HOSPITAL_COMMUNITY): Payer: Self-pay | Admitting: Orthopedic Surgery

## 2018-11-01 MED ORDER — OXYCODONE-ACETAMINOPHEN 5-325 MG PO TABS: 1.0000 | ORAL_TABLET | ORAL | 0 refills | Status: DC | PRN

## 2018-11-01 MED ORDER — MELOXICAM 15 MG PO TABS: 15.0000 mg | ORAL_TABLET | Freq: Every day | ORAL | 1 refills | Status: DC | PRN

## 2018-11-01 MED ORDER — METHOCARBAMOL 500 MG PO TABS: 500.0000 mg | ORAL_TABLET | Freq: Three times a day (TID) | ORAL | 1 refills | Status: DC | PRN

## 2018-11-01 NOTE — Evaluation (Signed)
Occupational Therapy Evaluation Patient Details Name: Todd Myers MRN: [id number] DOB: Sep 07, 1941 Today's Date: 11/01/2018    History of Present Illness Todd Myers is a 78 y.o. admitted on 10/31/2018 with a diagnosis of Right shoulder osteoarthritis.  They were brought to the operating room on 10/31/2018 and underwent Procedure(s):   Clinical Impression   OT eval and education complete.  Handout provided.       Follow Up Recommendations  Follow surgeon's recommendation for DC plan and follow-up therapies    Equipment Recommendations  None recommended by OT     Precautions:    Pendulums - OK Lap Slides - OK  Able to use for ADL activity within ER 20, Abduction 45, FF 60.    Sling for support        Mobility Bed Mobility Overal bed mobility: Needs Assistance Bed Mobility: Supine to Sit     Supine to sit: Min assist        Transfers Overall transfer level: Needs assistance   Transfers: Sit to/from Stand;Stand Pivot Transfers Sit to Stand: Supervision Stand pivot transfers: Supervision                              Pertinent Vitals/Pain Pain Assessment: No/denies pain        Extremity/Trunk Assessment Upper Extremity Assessment Upper Extremity Assessment: RUE deficits/detail RUE Deficits / Details: Ok to instruct Pendulums and lap slides as exercises. Ok to use operative arm within the following parameters for ADL purposes.  ER 20, ABD 45, FF 60 RUE Coordination: decreased gross motor;decreased fine motor              Cognition Arousal/Alertness: Awake/alert Behavior During Therapy: WFL for tasks assessed/performed Overall Cognitive Status: Within Functional Limits for tasks assessed                                           Shoulder Instructions Shoulder Instructions Donning/doffing shirt without moving shoulder: Minimal assistance;Caregiver independent with task Method for sponge bathing under operated UE: Minimal  assistance;Caregiver independent with task Donning/doffing sling/immobilizer: Minimal assistance;Caregiver independent with task Correct positioning of sling/immobilizer: Minimal assistance;Caregiver independent with task Pendulum exercises (written home exercise program): Minimal assistance;Caregiver independent with task ROM for elbow, wrist and digits of operated UE: Minimal assistance;Caregiver independent with task Sling wearing schedule (on at all times/off for ADL's): Minimal assistance;Caregiver independent with task Proper positioning of operated UE when showering: Minimal assistance;Caregiver independent with task Positioning of UE while sleeping: Minimal assistance;Caregiver independent with task                   AM-PAC OT "6 Clicks" Daily Activity     Outcome Measure Help from another person eating meals?: None Help from another person taking care of personal grooming?: None Help from another person toileting, which includes using toliet, bedpan, or urinal?: A Little Help from another person bathing (including washing, rinsing, drying)?: A Little Help from another person to put on and taking off regular upper body clothing?: A Little Help from another person to put on and taking off regular lower body clothing?: A Little 6 Click Score: 20   End of Session Nurse Communication: Mobility status  Activity Tolerance: Patient tolerated treatment well Patient left: in chair;with call bell/phone within reach  Time: 1100-1130 OT Time Calculation (min): 30 min Charges:  OT General Charges $OT Visit: 1 Visit OT Evaluation $OT Eval Low Complexity: 1 Low OT Treatments $Self Care/Home Management : 8-22 mins  Kari Baars, OT Acute Rehabilitation Services [phone] Office- [phone]     Lani Mendiola, Edwena Felty D 11/01/2018, 1:25 PM

## 2018-11-01 NOTE — Care Management Note (Signed)
Case Management Note  Patient Details  Name: Todd Myers MRN: [id number] Date of Birth: [birth year]  Subjective/Objective:                  Discharge planning  Action/Plan: Discharged to home with self-care, orders checked for hhc needs. No CM needs present at time of discharge.  Patient is able to arrangement own appointments and home care.  Expected Discharge Date:  11/01/18               Expected Discharge Plan:  Home/Self Care  In-House Referral:     Discharge planning Services  CM Consult  Post Acute Care Choice:    Choice offered to:     DME Arranged:    DME Agency:     HH Arranged:    HH Agency:     Status of Service:  Completed, signed off  If discussed at [employer] of Stay Meetings, dates discussed:    Additional Comments:  Leeroy Cha, RN 11/01/2018, 9:28 AM

## 2018-11-01 NOTE — Discharge Summary (Signed)
PATIENT ID:      Todd Myers  MRN:     [id number] DOB/AGE:    14-Sep-1940 / 78 y.o.     DISCHARGE SUMMARY  ADMISSION DATE:    10/31/2018 DISCHARGE DATE:    ADMISSION DIAGNOSIS: Right shoulder osteoarthritis Past Medical History:  Diagnosis Date  . BPH associated with nocturia    w/ hx elevated psa , negative biopsy's  . Dyslipidemia   . GERD (gastroesophageal reflux disease)   . HTN (hypertension)   . OA (osteoarthritis)    right shoulder and left shoulder  . Wears glasses   . Wears hearing aid in both ears     DISCHARGE DIAGNOSIS:   Active Problems:   S/P reverse total shoulder arthroplasty, right   PROCEDURE: Procedure(s): RIGHT REVERSE SHOULDER ARTHROPLASTY on 10/31/2018  CONSULTS:    HISTORY:  See H&P in chart.  HOSPITAL COURSE:  Todd Myers is a 78 y.o. admitted on 10/31/2018 with a diagnosis of Right shoulder osteoarthritis.  They were brought to the operating room on 10/31/2018 and underwent Procedure(s): RIGHT REVERSE SHOULDER ARTHROPLASTY.    They were given perioperative antibiotics:  Anti-infectives (From admission, onward)   Start     Dose/Rate Route Frequency Ordered Stop   10/31/18 0745  ceFAZolin (ANCEF) IVPB 2g/100 mL premix     2 g 200 mL/hr over 30 Minutes Intravenous On call to O.R. 10/31/18 7619 10/31/18 1005    .  Patient underwent the above named procedure and tolerated it well. The following day they were hemodynamically stable and pain was controlled on oral analgesics. They were neurovascularly intact to the operative extremity. OT was ordered and worked with patient per protocol. They were medically and orthopaedically stable for discharge on .    DIAGNOSTIC STUDIES:  RECENT RADIOGRAPHIC STUDIES :  No results found.  RECENT VITAL SIGNS:   Patient Vitals for the past 24 hrs:  BP Temp Temp src Pulse Resp SpO2  11/01/18 0523 117/78 (!) 97.5 F (36.4 C) Oral (!) 59 16 99 %  11/01/18 0009 118/81 97.8 F (36.6 C) Oral 73 16 98 %  10/31/18  2049 132/78 (!) 97.5 F (36.4 C) Oral 80 16 97 %  10/31/18 1603 121/78 (!) 97.5 F (36.4 C) Oral 77 - 97 %  10/31/18 1508 134/82 (!) 97.4 F (36.3 C) Oral 82 - 99 %  10/31/18 1354 112/75 (!) 97.5 F (36.4 C) Oral 70 17 -  10/31/18 1301 130/75 97.6 F (36.4 C) Oral 73 16 94 %  10/31/18 1245 114/77 97.6 F (36.4 C) - 76 12 98 %  10/31/18 1230 114/74 - - 74 14 97 %  10/31/18 1215 115/65 - - 86 13 99 %  10/31/18 1200 131/68 (!) 97.5 F (36.4 C) - 90 (!) 23 98 %  10/31/18 1149 130/85 - - 99 - 98 %  10/31/18 0945 - - - 73 - 95 %  10/31/18 0940 - - - 71 11 95 %  10/31/18 0937 120/72 - - 76 11 95 %  10/31/18 0935 - - - 77 10 95 %  10/31/18 0930 - - - 71 - 94 %  10/31/18 0928 - - - 75 17 94 %  10/31/18 0926 - - - 67 11 93 %  10/31/18 0924 - - - 73 15 95 %  10/31/18 0922 116/73 - - 70 16 94 %  10/31/18 0920 - - - 61 12 94 %  10/31/18 0918 - - - 62 12 94 %  10/31/18  0916 - - - 65 13 97 %  10/31/18 0914 - - - 63 10 97 %  10/31/18 0912 (!) 113/99 - - 68 14 99 %  10/31/18 0910 - - - - 15 -  .  RECENT EKG RESULTS:    Orders placed or performed during the hospital encounter of 10/25/18  . EKG 12 lead  . EKG 12 lead    DISCHARGE INSTRUCTIONS:    DISCHARGE MEDICATIONS:   Allergies as of 11/01/2018      Reactions   Sulfa Antibiotics Hives, Swelling   Swelling in mouth      Medication List    STOP taking these medications   naproxen 500 MG tablet Commonly known as:  NAPROSYN     TAKE these medications   Cholecalciferol 25 MCG (1000 UT) tablet Take 1,000 Units by mouth daily.   irbesartan 150 MG tablet Commonly known as:  AVAPRO Take 150 mg by mouth daily.   meloxicam 15 MG tablet Commonly known as:  MOBIC Take 1 tablet (15 mg total) by mouth daily as needed for pain.   methocarbamol 500 MG tablet Commonly known as:  ROBAXIN Take 1 tablet (500 mg total) by mouth 3 (three) times daily as needed for muscle spasms.   oxyCODONE-acetaminophen 5-325 MG tablet Commonly  known as:  PERCOCET Take 1 tablet by mouth every 4 (four) hours as needed (max 6 q).   ROLAIDS PO Take by mouth as needed.   simvastatin 20 MG tablet Commonly known as:  ZOCOR Take 20 mg by mouth every evening.   SYSTANE OP Place 1 drop into both eyes daily as needed (dry eyes).       FOLLOW UP VISIT:   Follow-up Information    Justice Britain, MD.   Specialty:  Orthopedic Surgery Why:  call to be seen in 10-14 days Contact information: [address] [place] [address] [phone]           DISCHARGE RJ:JOAC   DISCHARGE CONDITION:  Thereasa Parkin Todd Myers for Dr. Justice Britain 11/01/2018, 8:11 AM

## 2018-11-13 DIAGNOSIS — Z96611 Presence of right artificial shoulder joint: Secondary | ICD-10-CM | POA: Diagnosis not present

## 2018-11-13 DIAGNOSIS — Z4789 Encounter for other orthopedic aftercare: Secondary | ICD-10-CM | POA: Diagnosis not present

## 2018-11-29 DIAGNOSIS — M25511 Pain in right shoulder: Secondary | ICD-10-CM | POA: Diagnosis not present

## 2018-12-27 DIAGNOSIS — M25511 Pain in right shoulder: Secondary | ICD-10-CM | POA: Diagnosis not present

## 2019-01-10 DIAGNOSIS — M25511 Pain in right shoulder: Secondary | ICD-10-CM | POA: Diagnosis not present

## 2019-01-22 DIAGNOSIS — Z4789 Encounter for other orthopedic aftercare: Secondary | ICD-10-CM | POA: Diagnosis not present

## 2019-01-22 DIAGNOSIS — Z96611 Presence of right artificial shoulder joint: Secondary | ICD-10-CM | POA: Diagnosis not present

## 2019-01-24 DIAGNOSIS — M25511 Pain in right shoulder: Secondary | ICD-10-CM | POA: Diagnosis not present

## 2019-01-29 DIAGNOSIS — I872 Venous insufficiency (chronic) (peripheral): Secondary | ICD-10-CM | POA: Diagnosis not present

## 2019-01-29 DIAGNOSIS — I10 Essential (primary) hypertension: Secondary | ICD-10-CM | POA: Diagnosis not present

## 2019-02-06 DIAGNOSIS — M25511 Pain in right shoulder: Secondary | ICD-10-CM | POA: Diagnosis not present

## 2019-02-14 DIAGNOSIS — M25511 Pain in right shoulder: Secondary | ICD-10-CM | POA: Diagnosis not present

## 2019-02-21 DIAGNOSIS — M25511 Pain in right shoulder: Secondary | ICD-10-CM | POA: Diagnosis not present

## 2019-03-27 DIAGNOSIS — K4091 Unilateral inguinal hernia, without obstruction or gangrene, recurrent: Secondary | ICD-10-CM | POA: Diagnosis not present

## 2019-03-27 DIAGNOSIS — R1900 Intra-abdominal and pelvic swelling, mass and lump, unspecified site: Secondary | ICD-10-CM | POA: Diagnosis not present

## 2019-04-01 ENCOUNTER — Other Ambulatory Visit: Payer: Self-pay | Admitting: Adult Health

## 2019-04-01 DIAGNOSIS — R1909 Other intra-abdominal and pelvic swelling, mass and lump: Secondary | ICD-10-CM

## 2019-04-01 DIAGNOSIS — K4091 Unilateral inguinal hernia, without obstruction or gangrene, recurrent: Secondary | ICD-10-CM

## 2019-04-02 ENCOUNTER — Ambulatory Visit
Admission: RE | Admit: 2019-04-02 | Discharge: 2019-04-02 | Disposition: A | Discharge status: Home / Self Care | Payer: Medicare Other | Source: Ambulatory Visit | Attending: Adult Health | Admitting: Adult Health

## 2019-04-02 DIAGNOSIS — Z9889 Other specified postprocedural states: Secondary | ICD-10-CM | POA: Diagnosis not present

## 2019-04-02 DIAGNOSIS — R1909 Other intra-abdominal and pelvic swelling, mass and lump: Secondary | ICD-10-CM

## 2019-04-02 DIAGNOSIS — K4091 Unilateral inguinal hernia, without obstruction or gangrene, recurrent: Secondary | ICD-10-CM

## 2019-04-02 DIAGNOSIS — R1900 Intra-abdominal and pelvic swelling, mass and lump, unspecified site: Secondary | ICD-10-CM | POA: Diagnosis not present

## 2019-05-06 DIAGNOSIS — Z961 Presence of intraocular lens: Secondary | ICD-10-CM | POA: Diagnosis not present

## 2019-05-06 DIAGNOSIS — H52203 Unspecified astigmatism, bilateral: Secondary | ICD-10-CM | POA: Diagnosis not present

## 2019-05-06 DIAGNOSIS — H353121 Nonexudative age-related macular degeneration, left eye, early dry stage: Secondary | ICD-10-CM | POA: Diagnosis not present

## 2019-05-06 DIAGNOSIS — H21233 Degeneration of iris (pigmentary), bilateral: Secondary | ICD-10-CM | POA: Diagnosis not present

## 2019-05-21 DIAGNOSIS — Z96611 Presence of right artificial shoulder joint: Secondary | ICD-10-CM | POA: Diagnosis not present

## 2019-05-21 DIAGNOSIS — Z471 Aftercare following joint replacement surgery: Secondary | ICD-10-CM | POA: Diagnosis not present

## 2019-06-12 DIAGNOSIS — Z23 Encounter for immunization: Secondary | ICD-10-CM | POA: Diagnosis not present

## 2019-09-10 IMAGING — CT CT SHOULDER*R* W/O CM
1 of 6 series · 3 of 14 positions shown, 4 images · non-contrast
Comparison: None.

CLINICAL DATA: Right shoulder pain

EXAM:
CT OF THE UPPER RIGHT EXTREMITY WITHOUT CONTRAST
TECHNIQUE: Multidetector CT imaging of the upper right extremity was performed
according to the standard protocol.

[Series 2: shoulder 2.00 br40 s3 ax · axial · 0.41mm/px · z∈[-768,-593]mm · 3 of 89 slices shown, 4 images]
[im 1/89  soft-tissue]
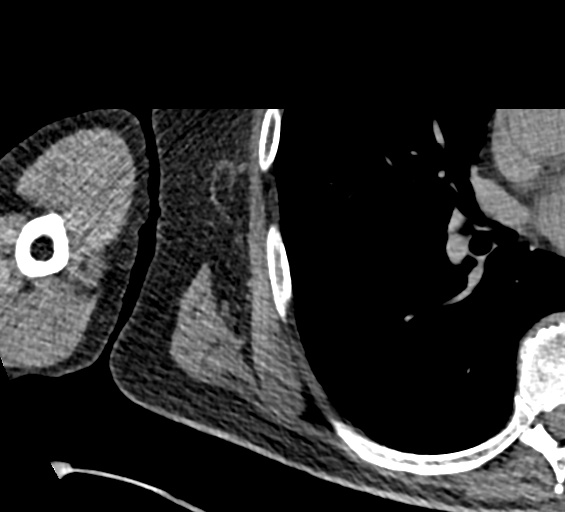
[im 1/89  bone]
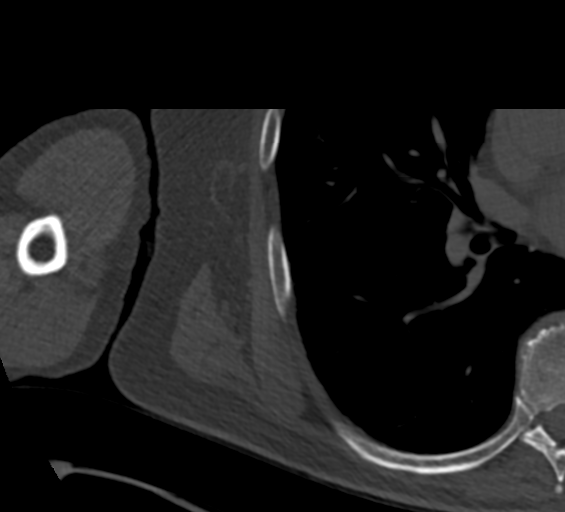
[im 45/89  bone]
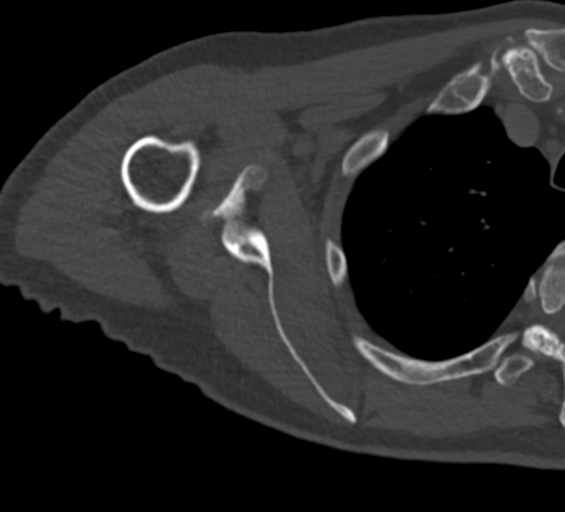
[im 89/89  bone]
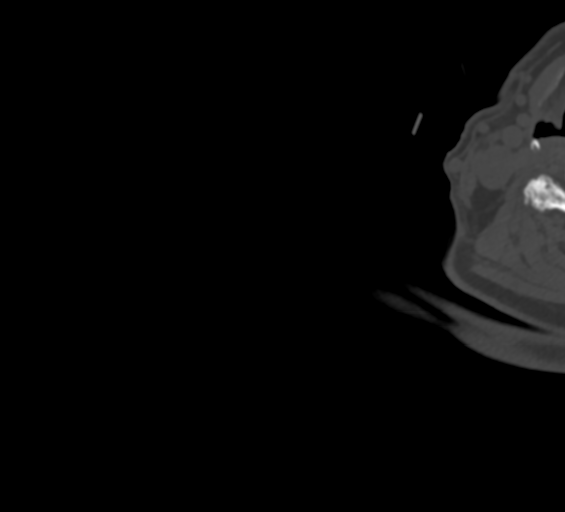

[3 of 14 positions shown; findings below may reference images not displayed]

FINDINGS: Bones/Joint/Cartilage

No fracture or dislocation. Normal alignment. No joint effusion.
Generalized osteopenia.

Severe right glenohumeral joint space narrowing with a bone-on-bone
appearance, subchondral sclerosis and subchondral cystic changes.
Bulky inferior marginal osteophytes. Moderate arthropathy of the
acromioclavicular joint.

No aggressive osseous lesion.

Ligaments

Ligaments are suboptimally evaluated by CT.

Muscles and Tendons
Muscles are normal.  No muscle atrophy.

Soft tissue
No fluid collection or hematoma. No soft tissue mass. Visualized
right lung is clear.
IMPRESSION: 1. Severe osteoarthritis of the right glenohumeral joint.

## 2019-10-14 DIAGNOSIS — Z125 Encounter for screening for malignant neoplasm of prostate: Secondary | ICD-10-CM | POA: Diagnosis not present

## 2019-10-14 DIAGNOSIS — E7849 Other hyperlipidemia: Secondary | ICD-10-CM | POA: Diagnosis not present

## 2019-10-14 DIAGNOSIS — I10 Essential (primary) hypertension: Secondary | ICD-10-CM | POA: Diagnosis not present

## 2019-10-14 DIAGNOSIS — R82998 Other abnormal findings in urine: Secondary | ICD-10-CM | POA: Diagnosis not present

## 2019-10-21 DIAGNOSIS — M25519 Pain in unspecified shoulder: Secondary | ICD-10-CM | POA: Diagnosis not present

## 2019-10-21 DIAGNOSIS — E785 Hyperlipidemia, unspecified: Secondary | ICD-10-CM | POA: Diagnosis not present

## 2019-10-21 DIAGNOSIS — Z0000 Encounter for general adult medical examination without abnormal findings: Secondary | ICD-10-CM | POA: Diagnosis not present

## 2019-10-21 DIAGNOSIS — N401 Enlarged prostate with lower urinary tract symptoms: Secondary | ICD-10-CM | POA: Diagnosis not present

## 2019-10-21 DIAGNOSIS — R972 Elevated prostate specific antigen [PSA]: Secondary | ICD-10-CM | POA: Diagnosis not present

## 2019-10-21 DIAGNOSIS — Z1339 Encounter for screening examination for other mental health and behavioral disorders: Secondary | ICD-10-CM | POA: Diagnosis not present

## 2019-10-21 DIAGNOSIS — M8580 Other specified disorders of bone density and structure, unspecified site: Secondary | ICD-10-CM | POA: Diagnosis not present

## 2019-10-21 DIAGNOSIS — I10 Essential (primary) hypertension: Secondary | ICD-10-CM | POA: Diagnosis not present

## 2019-10-21 DIAGNOSIS — Z1331 Encounter for screening for depression: Secondary | ICD-10-CM | POA: Diagnosis not present

## 2019-10-29 DIAGNOSIS — R972 Elevated prostate specific antigen [PSA]: Secondary | ICD-10-CM | POA: Diagnosis not present

## 2019-10-29 DIAGNOSIS — N5201 Erectile dysfunction due to arterial insufficiency: Secondary | ICD-10-CM | POA: Diagnosis not present

## 2019-10-29 DIAGNOSIS — N401 Enlarged prostate with lower urinary tract symptoms: Secondary | ICD-10-CM | POA: Diagnosis not present

## 2019-10-29 DIAGNOSIS — R3912 Poor urinary stream: Secondary | ICD-10-CM | POA: Diagnosis not present

## 2019-11-03 DIAGNOSIS — Z96611 Presence of right artificial shoulder joint: Secondary | ICD-10-CM | POA: Diagnosis not present

## 2019-11-03 DIAGNOSIS — Z471 Aftercare following joint replacement surgery: Secondary | ICD-10-CM | POA: Diagnosis not present

## 2019-11-10 DIAGNOSIS — L570 Actinic keratosis: Secondary | ICD-10-CM | POA: Diagnosis not present

## 2019-11-10 DIAGNOSIS — D225 Melanocytic nevi of trunk: Secondary | ICD-10-CM | POA: Diagnosis not present

## 2019-11-10 DIAGNOSIS — L821 Other seborrheic keratosis: Secondary | ICD-10-CM | POA: Diagnosis not present

## 2019-11-10 DIAGNOSIS — D1801 Hemangioma of skin and subcutaneous tissue: Secondary | ICD-10-CM | POA: Diagnosis not present

## 2019-11-10 DIAGNOSIS — L853 Xerosis cutis: Secondary | ICD-10-CM | POA: Diagnosis not present

## 2019-11-11 DIAGNOSIS — M8589 Other specified disorders of bone density and structure, multiple sites: Secondary | ICD-10-CM | POA: Diagnosis not present

## 2019-11-11 DIAGNOSIS — M8580 Other specified disorders of bone density and structure, unspecified site: Secondary | ICD-10-CM | POA: Diagnosis not present

## 2020-04-22 DIAGNOSIS — M25562 Pain in left knee: Secondary | ICD-10-CM | POA: Diagnosis not present

## 2020-04-28 DIAGNOSIS — N5201 Erectile dysfunction due to arterial insufficiency: Secondary | ICD-10-CM | POA: Diagnosis not present

## 2020-04-28 DIAGNOSIS — R972 Elevated prostate specific antigen [PSA]: Secondary | ICD-10-CM | POA: Diagnosis not present

## 2020-04-28 DIAGNOSIS — R3912 Poor urinary stream: Secondary | ICD-10-CM | POA: Diagnosis not present

## 2020-04-28 DIAGNOSIS — N401 Enlarged prostate with lower urinary tract symptoms: Secondary | ICD-10-CM | POA: Diagnosis not present

## 2020-05-06 DIAGNOSIS — M25562 Pain in left knee: Secondary | ICD-10-CM | POA: Diagnosis not present

## 2020-05-10 ENCOUNTER — Other Ambulatory Visit: Payer: Self-pay | Admitting: Urology

## 2020-05-10 DIAGNOSIS — R972 Elevated prostate specific antigen [PSA]: Secondary | ICD-10-CM

## 2020-05-11 DIAGNOSIS — H35372 Puckering of macula, left eye: Secondary | ICD-10-CM | POA: Diagnosis not present

## 2020-05-11 DIAGNOSIS — H21233 Degeneration of iris (pigmentary), bilateral: Secondary | ICD-10-CM | POA: Diagnosis not present

## 2020-05-11 DIAGNOSIS — H52203 Unspecified astigmatism, bilateral: Secondary | ICD-10-CM | POA: Diagnosis not present

## 2020-05-11 DIAGNOSIS — H353121 Nonexudative age-related macular degeneration, left eye, early dry stage: Secondary | ICD-10-CM | POA: Diagnosis not present

## 2020-06-03 ENCOUNTER — Other Ambulatory Visit: Payer: Self-pay

## 2020-06-03 ENCOUNTER — Ambulatory Visit
Admission: RE | Admit: 2020-06-03 | Discharge: 2020-06-03 | Disposition: A | Discharge status: Home / Self Care | Payer: Medicare Other | Source: Ambulatory Visit | Attending: Urology | Admitting: Urology

## 2020-06-03 DIAGNOSIS — R972 Elevated prostate specific antigen [PSA]: Secondary | ICD-10-CM

## 2020-06-03 MED ORDER — GADOBENATE DIMEGLUMINE 529 MG/ML IV SOLN
12.0000 mL | Freq: Once | INTRAVENOUS | Status: AC | PRN
  Administered 2020-06-03: 12 mL via INTRAVENOUS

## 2020-07-12 DIAGNOSIS — R972 Elevated prostate specific antigen [PSA]: Secondary | ICD-10-CM | POA: Diagnosis not present

## 2020-10-15 DIAGNOSIS — E785 Hyperlipidemia, unspecified: Secondary | ICD-10-CM | POA: Diagnosis not present

## 2020-10-15 DIAGNOSIS — M859 Disorder of bone density and structure, unspecified: Secondary | ICD-10-CM | POA: Diagnosis not present

## 2020-10-15 DIAGNOSIS — Z125 Encounter for screening for malignant neoplasm of prostate: Secondary | ICD-10-CM | POA: Diagnosis not present

## 2020-10-22 DIAGNOSIS — R82998 Other abnormal findings in urine: Secondary | ICD-10-CM | POA: Diagnosis not present

## 2020-10-22 DIAGNOSIS — R3129 Other microscopic hematuria: Secondary | ICD-10-CM | POA: Diagnosis not present

## 2020-10-22 DIAGNOSIS — Z1339 Encounter for screening examination for other mental health and behavioral disorders: Secondary | ICD-10-CM | POA: Diagnosis not present

## 2020-10-22 DIAGNOSIS — M8580 Other specified disorders of bone density and structure, unspecified site: Secondary | ICD-10-CM | POA: Diagnosis not present

## 2020-10-22 DIAGNOSIS — N401 Enlarged prostate with lower urinary tract symptoms: Secondary | ICD-10-CM | POA: Diagnosis not present

## 2020-10-22 DIAGNOSIS — R972 Elevated prostate specific antigen [PSA]: Secondary | ICD-10-CM | POA: Diagnosis not present

## 2020-10-22 DIAGNOSIS — M25562 Pain in left knee: Secondary | ICD-10-CM | POA: Diagnosis not present

## 2020-10-22 DIAGNOSIS — I10 Essential (primary) hypertension: Secondary | ICD-10-CM | POA: Diagnosis not present

## 2020-10-22 DIAGNOSIS — Z1212 Encounter for screening for malignant neoplasm of rectum: Secondary | ICD-10-CM | POA: Diagnosis not present

## 2020-10-22 DIAGNOSIS — Z23 Encounter for immunization: Secondary | ICD-10-CM | POA: Diagnosis not present

## 2020-10-22 DIAGNOSIS — E785 Hyperlipidemia, unspecified: Secondary | ICD-10-CM | POA: Diagnosis not present

## 2020-10-22 DIAGNOSIS — Z1331 Encounter for screening for depression: Secondary | ICD-10-CM | POA: Diagnosis not present

## 2020-10-22 DIAGNOSIS — Z0000 Encounter for general adult medical examination without abnormal findings: Secondary | ICD-10-CM | POA: Diagnosis not present

## 2020-10-22 DIAGNOSIS — M25512 Pain in left shoulder: Secondary | ICD-10-CM | POA: Diagnosis not present

## 2020-10-22 DIAGNOSIS — F17201 Nicotine dependence, unspecified, in remission: Secondary | ICD-10-CM | POA: Diagnosis not present

## 2020-11-22 DIAGNOSIS — D225 Melanocytic nevi of trunk: Secondary | ICD-10-CM | POA: Diagnosis not present

## 2020-11-22 DIAGNOSIS — L821 Other seborrheic keratosis: Secondary | ICD-10-CM | POA: Diagnosis not present

## 2020-11-22 DIAGNOSIS — L820 Inflamed seborrheic keratosis: Secondary | ICD-10-CM | POA: Diagnosis not present

## 2020-11-22 DIAGNOSIS — D1801 Hemangioma of skin and subcutaneous tissue: Secondary | ICD-10-CM | POA: Diagnosis not present

## 2020-12-07 DIAGNOSIS — R972 Elevated prostate specific antigen [PSA]: Secondary | ICD-10-CM | POA: Diagnosis not present

## 2020-12-10 DIAGNOSIS — R972 Elevated prostate specific antigen [PSA]: Secondary | ICD-10-CM | POA: Diagnosis not present

## 2020-12-10 DIAGNOSIS — R3912 Poor urinary stream: Secondary | ICD-10-CM | POA: Diagnosis not present

## 2020-12-10 DIAGNOSIS — N401 Enlarged prostate with lower urinary tract symptoms: Secondary | ICD-10-CM | POA: Diagnosis not present

## 2021-01-04 DIAGNOSIS — M19012 Primary osteoarthritis, left shoulder: Secondary | ICD-10-CM | POA: Diagnosis not present

## 2021-01-04 DIAGNOSIS — M25562 Pain in left knee: Secondary | ICD-10-CM | POA: Diagnosis not present

## 2021-01-04 DIAGNOSIS — M25512 Pain in left shoulder: Secondary | ICD-10-CM | POA: Diagnosis not present

## 2021-02-23 DIAGNOSIS — M25562 Pain in left knee: Secondary | ICD-10-CM | POA: Diagnosis not present

## 2021-03-01 DIAGNOSIS — S83271D Complex tear of lateral meniscus, current injury, right knee, subsequent encounter: Secondary | ICD-10-CM | POA: Diagnosis not present

## 2021-03-01 DIAGNOSIS — S83232D Complex tear of medial meniscus, current injury, left knee, subsequent encounter: Secondary | ICD-10-CM | POA: Diagnosis not present

## 2021-03-01 DIAGNOSIS — M25562 Pain in left knee: Secondary | ICD-10-CM | POA: Diagnosis not present

## 2021-05-20 IMAGING — MR MR PROSTATE WO/W CM
12 series · 48 of 48 positions shown · IV contrast (multihance)
Comparison: None.

CLINICAL DATA: Elevated PSA.

EXAM:
MR PROSTATE WITHOUT AND WITH CONTRAST
TECHNIQUE: Multiplanar multisequence MRI images were obtained of the pelvis
centered about the prostate. Pre and post contrast images were
obtained.
CONTRAST:  12mL MULTIHANCE GADOBENATE DIMEGLUMINE 529 MG/ML IV SOLN

[Series 3: T2 · coronal · 3.0mm · 0.56mm/px · 1 of 23 slices shown (1 of 3)]
[im 1/23]
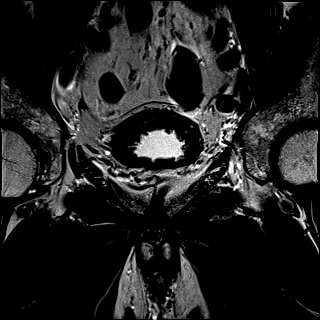

[Series 4: T1 · axial · 5.0mm · 1.25mm/px · 1 of 80 slices shown]
[im 1/80]
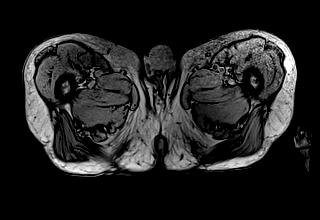

[Series 5: DWI · axial · 3.0mm · 1.75mm/px · z∈[-74,+1]mm · 2 of 78 slices shown (1 of 3)]
[im 1/78]
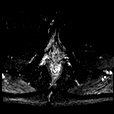
[im 78/78]
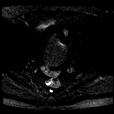

[Series 6: DWI · axial · 3.0mm · 1.75mm/px · 1 of 26 slices shown (2 of 3)]
[im 1/26]
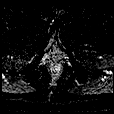

[Series 7: DWI · axial · 3.0mm · 1.75mm/px · 1 of 26 slices shown (3 of 3)]
[im 1/26]
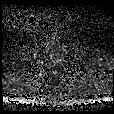

[Series 8: T2 · axial · 3.0mm · 0.56mm/px · 1 of 28 slices shown (2 of 3)]
[im 1/28]
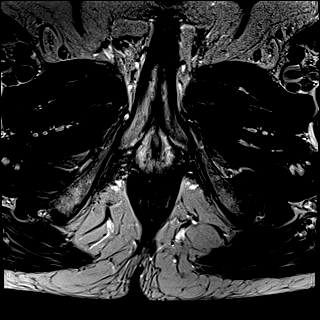

[Series 9: T2 · axial · 1.0mm · 1.04mm/px · z∈[-76,+3]mm · 2 of 80 slices shown (3 of 3)]
[im 1/80]
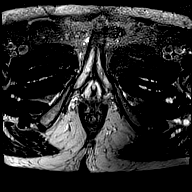
[im 80/80]
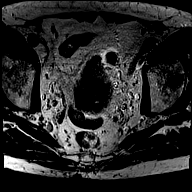

[Series 10: pre t1_twist_tra_dyn · axial · non-contrast · 3.5mm · 0.83mm/px · 1 of 22 slices shown]
[im 1/22]
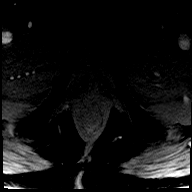

[Series 11: post t1_twist_tra_dyn-copy center · axial · non-contrast · 3.5mm · 0.83mm/px · z∈[-73,+0]mm · 17 of 660 slices shown]
[im 1/660]
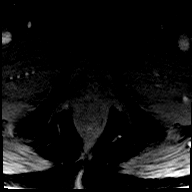
[im 42/660]
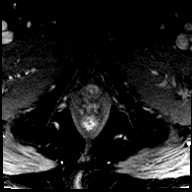
[im 83/660]
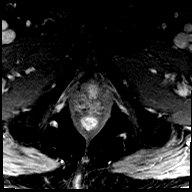
[im 124/660]
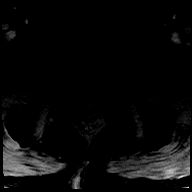
[im 165/660]
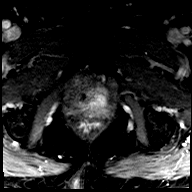
[im 206/660]
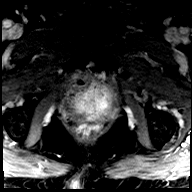
[im 248/660]
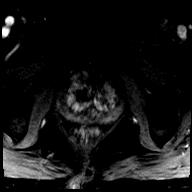
[im 289/660]
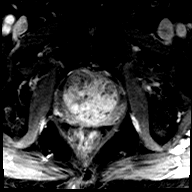
[im 330/660]
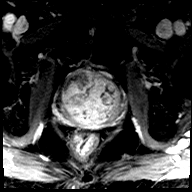
[im 371/660]
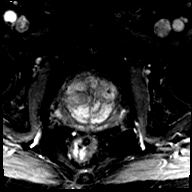
[im 412/660]
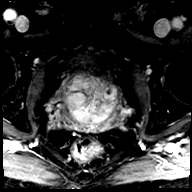
[im 454/660]
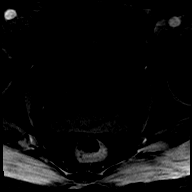
[im 495/660]
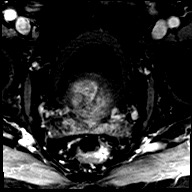
[im 536/660]
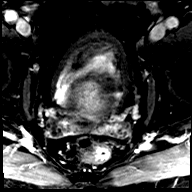
[im 577/660]
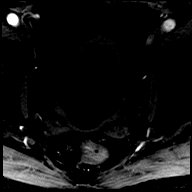
[im 618/660]
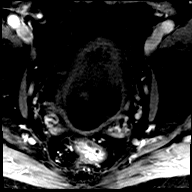
[im 660/660]
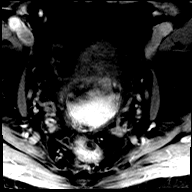

[Series 12: post t1_twist_tra_dyn-copy cent_sub · axial · 3.5mm · 0.83mm/px · z∈[-73,+0]mm · 17 of 637 slices shown]
[im 1/637]
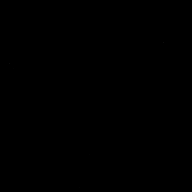
[im 40/637]
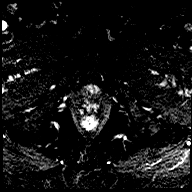
[im 80/637]
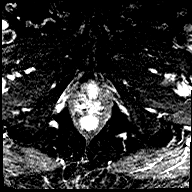
[im 120/637]
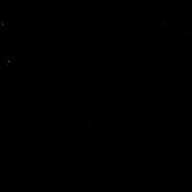
[im 160/637]
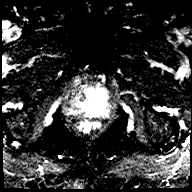
[im 199/637]
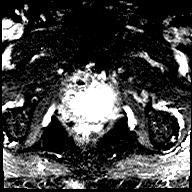
[im 239/637]
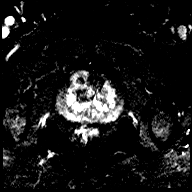
[im 279/637]
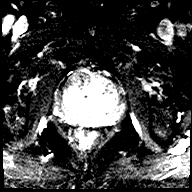
[im 319/637]
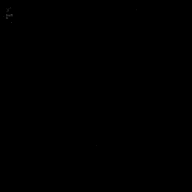
[im 358/637]
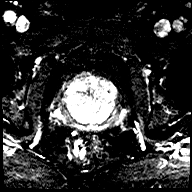
[im 398/637]
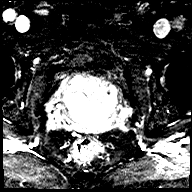
[im 438/637]
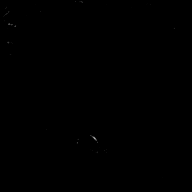
[im 478/637]
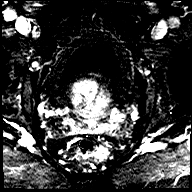
[im 517/637]
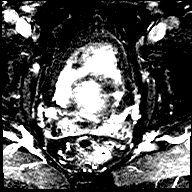
[im 557/637]
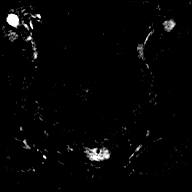
[im 597/637]
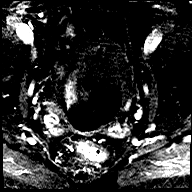
[im 637/637]
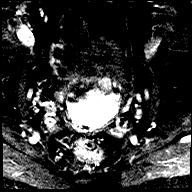

[Series 13: t1_vibe_dixon_tra_f · axial · 2.5mm · 0.91mm/px · z∈[-109,+88]mm · 2 of 80 slices shown]
[im 1/80]
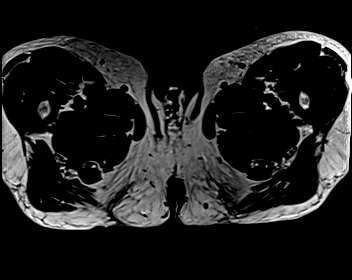
[im 80/80]
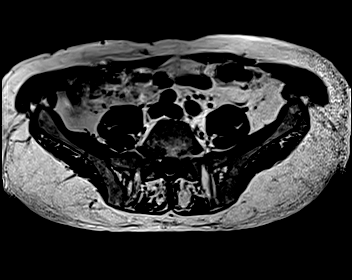

[Series 14: t1_vibe_dixon_tra_w · axial · 2.5mm · 0.91mm/px · z∈[-109,+88]mm · 2 of 80 slices shown]
[im 1/80]
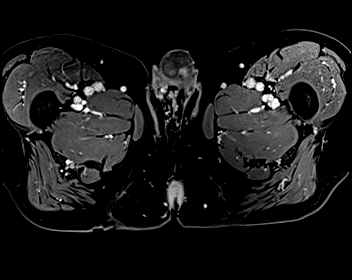
[im 80/80]
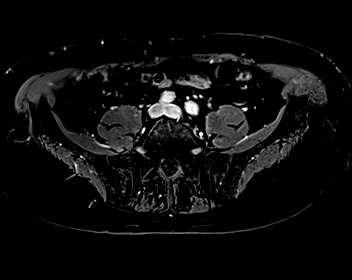

[48 of 48 positions shown; findings below may reference images not displayed]

FINDINGS: Prostate:

-- Peripheral Zone: No abnormality seen on ADC and high b-value DWI
sequences. An extruded BPH nodule is incidentally noted in the right
anterior mid gland and apex.

-- Transition/Central Zone: Circumscribed BPH nodules are noted, but
no suspicious nodules with obscured or non-circumscribed margins
seen.

-- Measurements/Volume:  5.8 x 4.8 x 6.2 cm (volume = 90 cm^3)

Transcapsular spread:  Absent

Seminal vesicle involvement:  Absent

Neurovascular bundle involvement:  Absent

Pelvic adenopathy: None visualized

Bone metastasis: None visualized

Other: Diffuse bladder wall thickening, consistent with chronic
bladder outlet obstruction.
IMPRESSION: No radiographic evidence of high-grade prostate carcinoma. PI-RADS
1: Very Low (clinically significant cancer is highly unlikely to be
present)

## 2021-06-03 DIAGNOSIS — H52203 Unspecified astigmatism, bilateral: Secondary | ICD-10-CM | POA: Diagnosis not present

## 2021-06-03 DIAGNOSIS — Z961 Presence of intraocular lens: Secondary | ICD-10-CM | POA: Diagnosis not present

## 2021-06-03 DIAGNOSIS — H353121 Nonexudative age-related macular degeneration, left eye, early dry stage: Secondary | ICD-10-CM | POA: Diagnosis not present

## 2021-06-03 DIAGNOSIS — H21233 Degeneration of iris (pigmentary), bilateral: Secondary | ICD-10-CM | POA: Diagnosis not present

## 2021-06-20 IMAGING — US US PELVIS LIMITED
1 series · 14 of 21 positions shown · non-contrast
Comparison: None.

CLINICAL DATA: Evaluate for right and left inguinal hernias.
History of bilateral inguinal hernia repair.

EXAM:
LIMITED ULTRASOUND OF PELVIS
TECHNIQUE: Limited transabdominal ultrasound examination of the pelvis was
performed.

[Series 1: us pelvis limited · 0.09mm/px · 21 acquisitions, 14 frames shown]
[im 1/21]
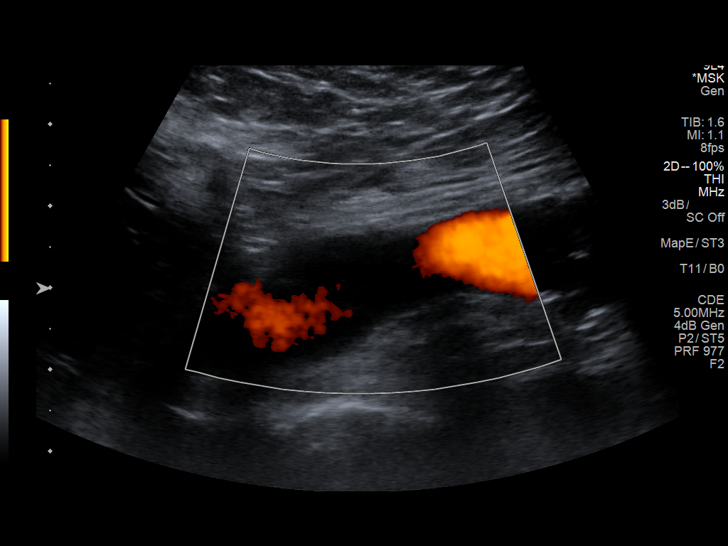
[im 3/21]
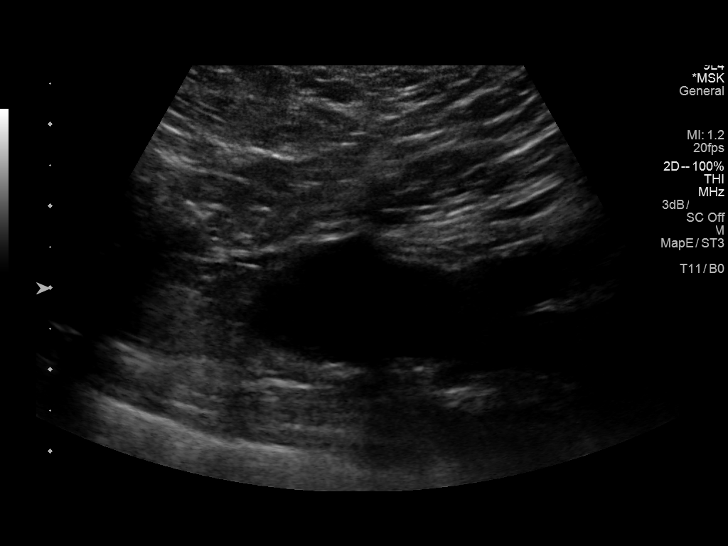
[im 4/21]
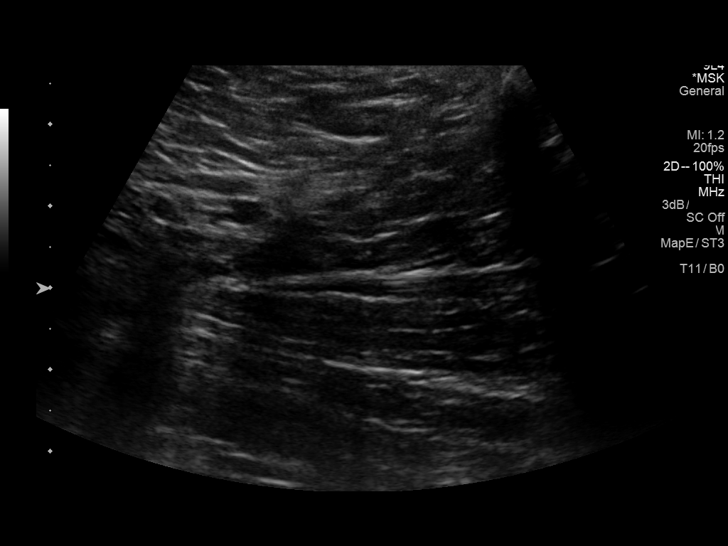
[im 6/21]
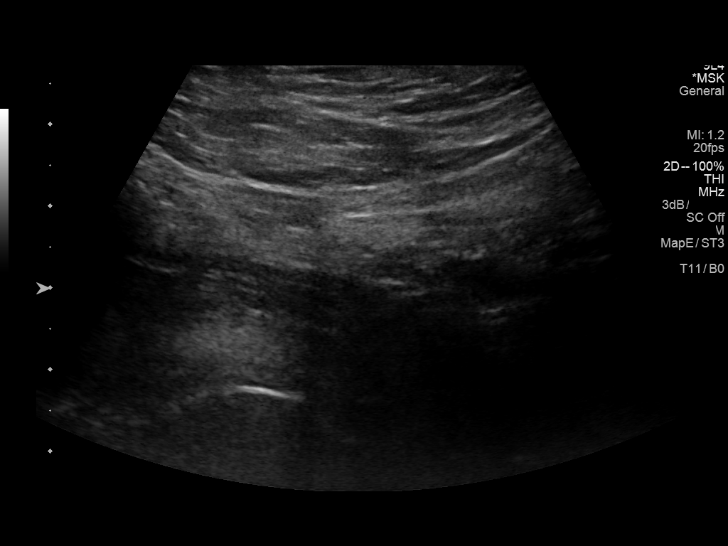
[im 7/21]
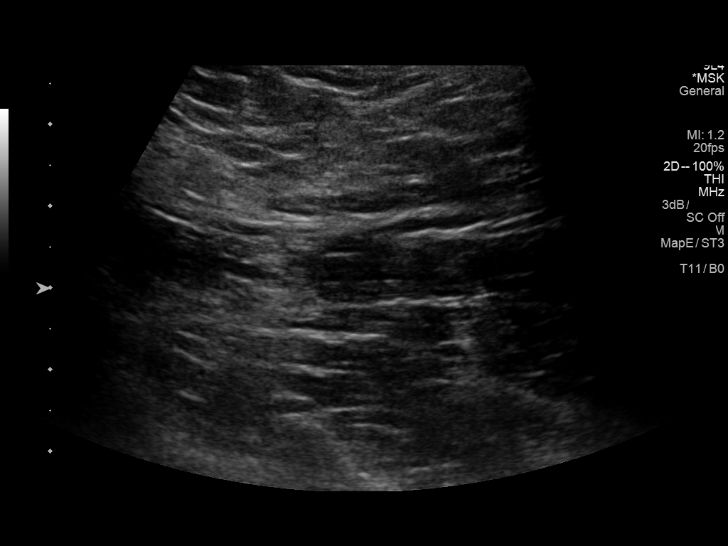
[im 9/21]
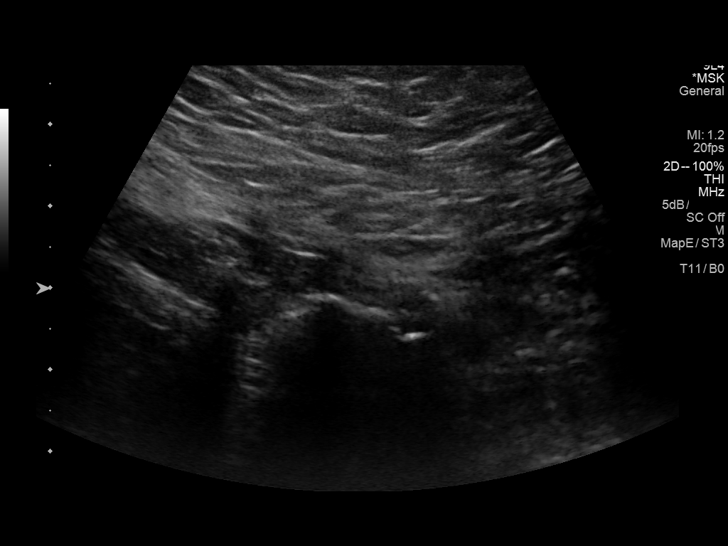
[im 10/21]
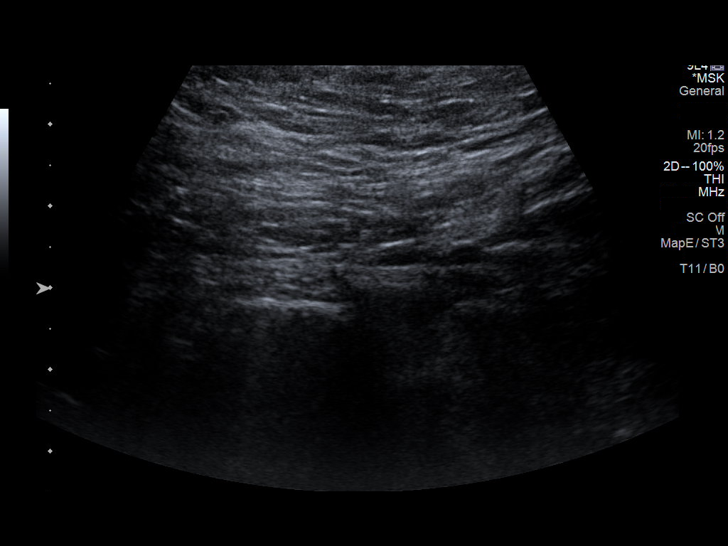
[im 12/21]
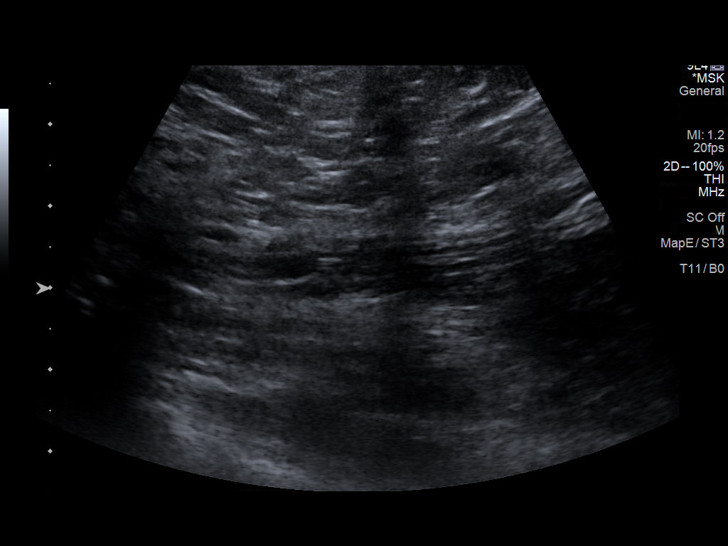
[im 13/21]
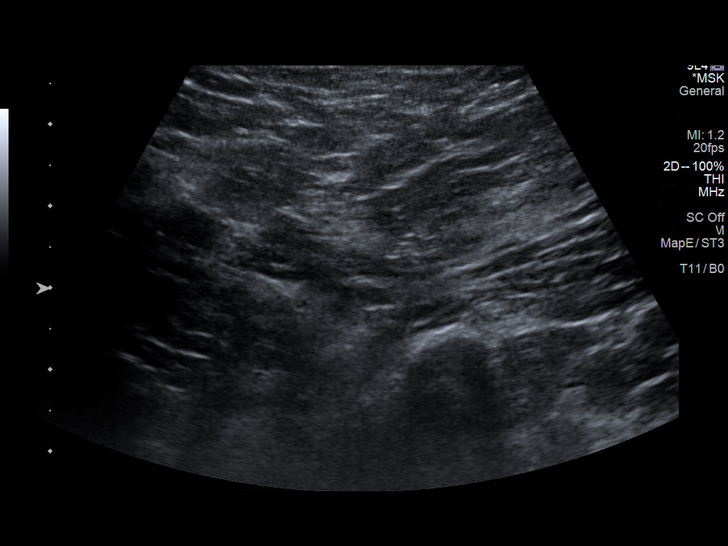
[im 15/21]
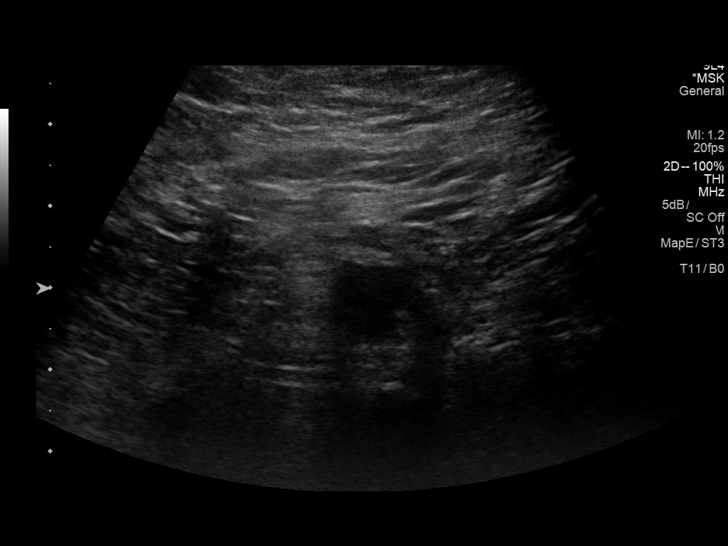
[im 16/21]
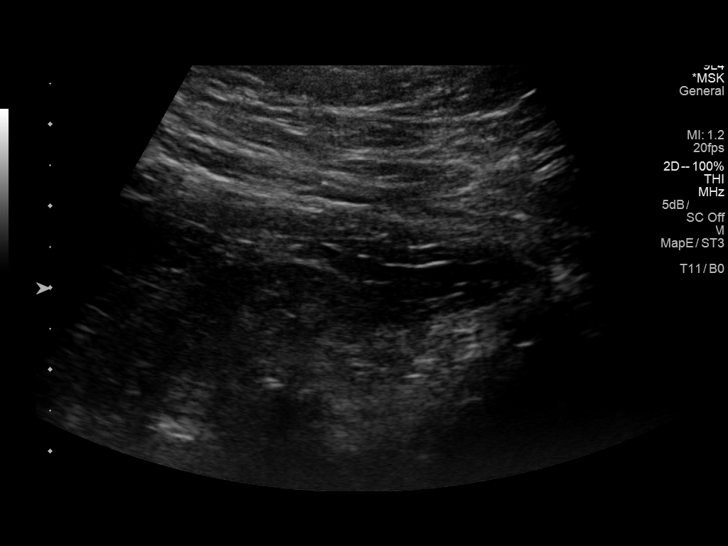
[im 18/21]
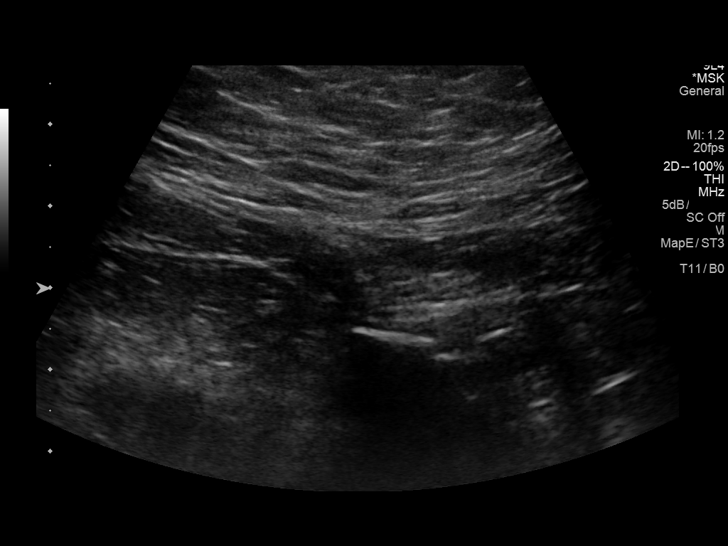
[im 19/21]
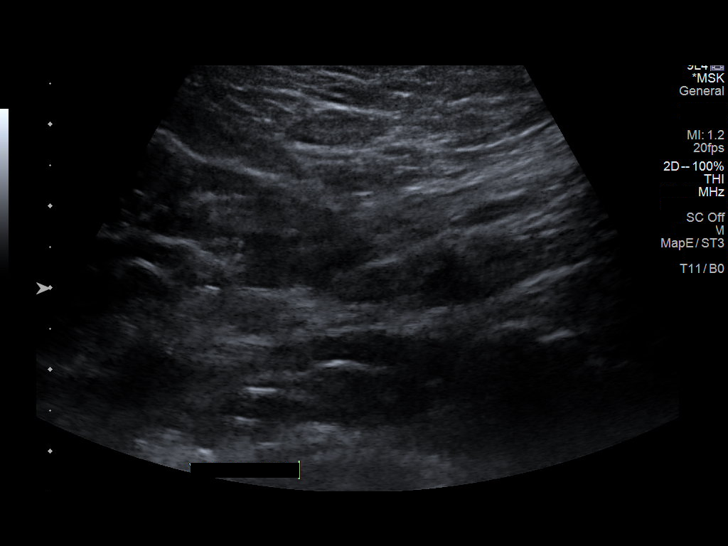
[im 21/21]
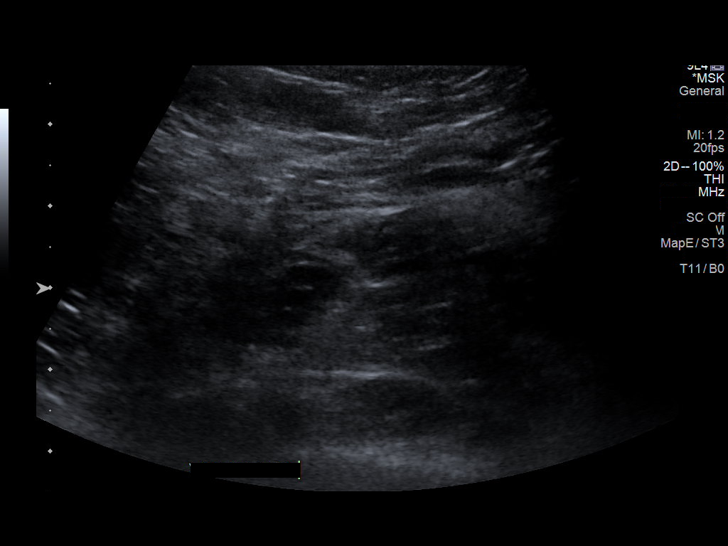

[14 of 21 positions shown; findings below may reference images not displayed]

FINDINGS: Ultrasound performed in the inguinal regions bilaterally. No visible
hernia bilaterally. No soft tissue mass.
IMPRESSION: No recurrent or residual hernia noted in the inguinal regions
bilaterally.

## 2021-08-17 DIAGNOSIS — R972 Elevated prostate specific antigen [PSA]: Secondary | ICD-10-CM | POA: Diagnosis not present

## 2021-08-24 DIAGNOSIS — N401 Enlarged prostate with lower urinary tract symptoms: Secondary | ICD-10-CM | POA: Diagnosis not present

## 2021-08-24 DIAGNOSIS — R3912 Poor urinary stream: Secondary | ICD-10-CM | POA: Diagnosis not present

## 2021-08-24 DIAGNOSIS — R972 Elevated prostate specific antigen [PSA]: Secondary | ICD-10-CM | POA: Diagnosis not present

## 2021-08-24 DIAGNOSIS — N5201 Erectile dysfunction due to arterial insufficiency: Secondary | ICD-10-CM | POA: Diagnosis not present

## 2021-08-25 DIAGNOSIS — Z23 Encounter for immunization: Secondary | ICD-10-CM | POA: Diagnosis not present

## 2021-08-29 DIAGNOSIS — Z20822 Contact with and (suspected) exposure to covid-19: Secondary | ICD-10-CM | POA: Diagnosis not present

## 2021-10-06 DIAGNOSIS — Z23 Encounter for immunization: Secondary | ICD-10-CM | POA: Diagnosis not present

## 2021-10-24 ENCOUNTER — Ambulatory Visit (INDEPENDENT_AMBULATORY_CARE_PROVIDER_SITE_OTHER): Payer: Medicare Other | Admitting: Podiatry

## 2021-10-24 ENCOUNTER — Telehealth: Payer: Self-pay | Admitting: Podiatry

## 2021-10-24 ENCOUNTER — Other Ambulatory Visit: Payer: Self-pay

## 2021-10-24 VITALS — BP 132/76 | HR 66 | Temp 98.1°F

## 2021-10-24 DIAGNOSIS — M79675 Pain in left toe(s): Secondary | ICD-10-CM | POA: Diagnosis not present

## 2021-10-24 DIAGNOSIS — L600 Ingrowing nail: Secondary | ICD-10-CM | POA: Diagnosis not present

## 2021-10-24 MED ORDER — CEPHALEXIN 500 MG PO CAPS: 500.0000 mg | ORAL_CAPSULE | Freq: Three times a day (TID) | ORAL | 0 refills | Status: DC

## 2021-10-24 NOTE — Telephone Encounter (Signed)
Patient called he would like to speak with Dr Jacqualyn Posey or nurse to clarify medication he was given. He thought he was getting an ointment to rub on his toe, but he got a antibiotic? Please advise

## 2021-10-24 NOTE — Patient Instructions (Addendum)

## 2021-10-25 NOTE — Telephone Encounter (Signed)
Called patient and gave him the instructions that Dr Jacqualyn Posey said,  use a little neosporin and take the cephalexin as prescribed.  Patient states understanding and thanks you for the response back.

## 2021-10-30 NOTE — Progress Notes (Signed)
Subjective:   Patient ID: Todd Myers, male   DOB: 81 y.o.   MRN: [id number]   HPI 81 year old male presents the office today for concerns of ingrown toenail left big toe which started about 3 weeks ago.  The area is been sore and red.  Patient soap and Dial soap which is been helping some as well as doxycycline that was prescribed by his primary care provider.  States that he previously had a "boil" that he popped. Currently denies any fevers or chills.   Review of Systems  All other systems reviewed and are negative.      Objective:  Physical Exam  General: AAO x3, NAD  Dermatological: Incurvation present to the left medial hallux nail border with localized edema and erythema but there is no drainage or pus identified today there is no ascending cellulitis.  No fluctuation or crepitation.  Vascular: Dorsalis Pedis artery and Posterior Tibial artery pedal pulses are 2/4 bilateral with immedate capillary fill time. There is no pain with calf compression, swelling, warmth, erythema.   Neruologic: Grossly intact via light touch bilateral.   Musculoskeletal: Tensional ingrown toenail but no other areas of discomfort.  Muscular strength 5/5 in all groups tested bilateral.  Gait: Unassisted, Nonantalgic.       Assessment:   Left medial hallux ingrown toenail     Plan:  -Treatment options discussed including all alternatives, risks, and complications -Etiology of symptoms were discussed -At this time, the patient is requesting partial nail removal with chemical matricectomy to the symptomatic portion of the nail. Risks and complications were discussed with the patient for which they understand and written consent was obtained. Under sterile conditions a total of 3 mL of a mixture of 2% lidocaine plain and 0.5% Marcaine plain was infiltrated in a hallux block fashion. Once anesthetized, the skin was prepped in sterile fashion. A tourniquet was then applied. Next the medial aspect of  hallux nail border was then sharply excised making sure to remove the entire offending nail border. Once the nails were ensured to be removed area was debrided and the underlying skin was intact. There is no purulence identified in the procedure. Next phenol was then applied under standard conditions and copiously irrigated. Silvadene was applied. A dry sterile dressing was applied. After application of the dressing the tourniquet was removed and there is found to be an immediate capillary refill time to the digit. The patient tolerated the procedure well any complications. Post procedure instructions were discussed the patient for which he verbally understood. Follow-up in one week for nail check or sooner if any problems are to arise. Discussed signs/symptoms of infection and directed to call the office immediately should any occur or go directly to the emergency room. In the meantime, encouraged to call the office with any questions, concerns, changes symptoms. -Keflex  Return for nail check in 1-2 weeks.  Trula Slade DPM

## 2021-10-31 ENCOUNTER — Encounter: Payer: Self-pay | Admitting: Podiatry

## 2021-11-08 ENCOUNTER — Ambulatory Visit (INDEPENDENT_AMBULATORY_CARE_PROVIDER_SITE_OTHER): Payer: Medicare Other | Admitting: Podiatry

## 2021-11-08 ENCOUNTER — Other Ambulatory Visit: Payer: Self-pay

## 2021-11-08 DIAGNOSIS — L600 Ingrowing nail: Secondary | ICD-10-CM

## 2021-11-09 NOTE — Progress Notes (Signed)
Subjective: 81 year old male presents the office today for evaluation after undergoing partial nail avulsion of his left big toe.  He states he has little tenderness to the area but overall doing much better.  No drainage or pus.  He still soak in Epsom salts.  Denies any fevers or chills.  No other concerns.  Objective: AAO x3, NAD DP/PT pulses palpable bilaterally, CRT less than 3 seconds Status post partial nail avulsion.  Appears of the procedure site is healed.  Minimal edema there is no erythema.  There is no drainage or pus.  Small scab is still present. No pain with calf compression, swelling, warmth, erythema  Assessment: Status post partial nail avulsion, healing well  Plan: -All treatment options discussed with the patient including all alternatives, risks, complications.  -Continue soaking Epsom salts for another week once a day cover with antibiotic ointment and a bandage in the day.  Leave the area open at nighttime.  Monitor for any signs of infection or reoccurrence. -Patient encouraged to call the office with any questions, concerns, change in symptoms.   Trula Slade DPM

## 2021-11-11 DIAGNOSIS — Z125 Encounter for screening for malignant neoplasm of prostate: Secondary | ICD-10-CM | POA: Diagnosis not present

## 2021-11-11 DIAGNOSIS — I10 Essential (primary) hypertension: Secondary | ICD-10-CM | POA: Diagnosis not present

## 2021-11-11 DIAGNOSIS — M8580 Other specified disorders of bone density and structure, unspecified site: Secondary | ICD-10-CM | POA: Diagnosis not present

## 2021-11-11 DIAGNOSIS — E785 Hyperlipidemia, unspecified: Secondary | ICD-10-CM | POA: Diagnosis not present

## 2021-11-18 DIAGNOSIS — R3129 Other microscopic hematuria: Secondary | ICD-10-CM | POA: Diagnosis not present

## 2021-11-18 DIAGNOSIS — M25512 Pain in left shoulder: Secondary | ICD-10-CM | POA: Diagnosis not present

## 2021-11-18 DIAGNOSIS — Z0000 Encounter for general adult medical examination without abnormal findings: Secondary | ICD-10-CM | POA: Diagnosis not present

## 2021-11-18 DIAGNOSIS — N401 Enlarged prostate with lower urinary tract symptoms: Secondary | ICD-10-CM | POA: Diagnosis not present

## 2021-11-18 DIAGNOSIS — E785 Hyperlipidemia, unspecified: Secondary | ICD-10-CM | POA: Diagnosis not present

## 2021-11-18 DIAGNOSIS — Z1331 Encounter for screening for depression: Secondary | ICD-10-CM | POA: Diagnosis not present

## 2021-11-18 DIAGNOSIS — R972 Elevated prostate specific antigen [PSA]: Secondary | ICD-10-CM | POA: Diagnosis not present

## 2021-11-18 DIAGNOSIS — R82998 Other abnormal findings in urine: Secondary | ICD-10-CM | POA: Diagnosis not present

## 2021-11-18 DIAGNOSIS — Z1339 Encounter for screening examination for other mental health and behavioral disorders: Secondary | ICD-10-CM | POA: Diagnosis not present

## 2021-11-18 DIAGNOSIS — M8580 Other specified disorders of bone density and structure, unspecified site: Secondary | ICD-10-CM | POA: Diagnosis not present

## 2021-11-18 DIAGNOSIS — F17201 Nicotine dependence, unspecified, in remission: Secondary | ICD-10-CM | POA: Diagnosis not present

## 2021-11-18 DIAGNOSIS — M25562 Pain in left knee: Secondary | ICD-10-CM | POA: Diagnosis not present

## 2021-11-18 DIAGNOSIS — I10 Essential (primary) hypertension: Secondary | ICD-10-CM | POA: Diagnosis not present

## 2021-12-01 DIAGNOSIS — M8589 Other specified disorders of bone density and structure, multiple sites: Secondary | ICD-10-CM | POA: Diagnosis not present

## 2021-12-26 DIAGNOSIS — D1801 Hemangioma of skin and subcutaneous tissue: Secondary | ICD-10-CM | POA: Diagnosis not present

## 2021-12-26 DIAGNOSIS — D225 Melanocytic nevi of trunk: Secondary | ICD-10-CM | POA: Diagnosis not present

## 2021-12-26 DIAGNOSIS — L821 Other seborrheic keratosis: Secondary | ICD-10-CM | POA: Diagnosis not present

## 2022-05-24 DIAGNOSIS — H52203 Unspecified astigmatism, bilateral: Secondary | ICD-10-CM | POA: Diagnosis not present

## 2022-05-24 DIAGNOSIS — H353121 Nonexudative age-related macular degeneration, left eye, early dry stage: Secondary | ICD-10-CM | POA: Diagnosis not present

## 2022-05-24 DIAGNOSIS — Z961 Presence of intraocular lens: Secondary | ICD-10-CM | POA: Diagnosis not present

## 2022-08-15 DIAGNOSIS — R972 Elevated prostate specific antigen [PSA]: Secondary | ICD-10-CM | POA: Diagnosis not present

## 2022-08-22 DIAGNOSIS — N401 Enlarged prostate with lower urinary tract symptoms: Secondary | ICD-10-CM | POA: Diagnosis not present

## 2022-08-22 DIAGNOSIS — R3912 Poor urinary stream: Secondary | ICD-10-CM | POA: Diagnosis not present

## 2022-08-22 DIAGNOSIS — N5201 Erectile dysfunction due to arterial insufficiency: Secondary | ICD-10-CM | POA: Diagnosis not present

## 2022-08-22 DIAGNOSIS — R972 Elevated prostate specific antigen [PSA]: Secondary | ICD-10-CM | POA: Diagnosis not present

## 2022-08-30 ENCOUNTER — Emergency Department (HOSPITAL_BASED_OUTPATIENT_CLINIC_OR_DEPARTMENT_OTHER)
Admission: EM | Admit: 2022-08-30 | Discharge: 2022-08-30 | Disposition: A | Discharge status: Home / Self Care | Payer: Medicare Other | Attending: Emergency Medicine | Admitting: Emergency Medicine

## 2022-08-30 ENCOUNTER — Encounter (HOSPITAL_BASED_OUTPATIENT_CLINIC_OR_DEPARTMENT_OTHER): Payer: Self-pay

## 2022-08-30 ENCOUNTER — Other Ambulatory Visit: Payer: Self-pay

## 2022-08-30 DIAGNOSIS — M542 Cervicalgia: Secondary | ICD-10-CM | POA: Diagnosis not present

## 2022-08-30 DIAGNOSIS — M62838 Other muscle spasm: Secondary | ICD-10-CM | POA: Diagnosis not present

## 2022-08-30 MED ORDER — DOCUSATE SODIUM 100 MG PO CAPS: 100.0000 mg | ORAL_CAPSULE | Freq: Two times a day (BID) | ORAL | 0 refills | Status: DC

## 2022-08-30 MED ORDER — OXYCODONE-ACETAMINOPHEN 5-325 MG PO TABS: 1.0000 | ORAL_TABLET | Freq: Four times a day (QID) | ORAL | 0 refills | Status: DC | PRN

## 2022-08-30 MED ORDER — METHOCARBAMOL 500 MG PO TABS
500.0000 mg | ORAL_TABLET | Freq: Once | ORAL | Status: AC
  Administered 2022-08-30: 500 mg via ORAL
  Filled 2022-08-30: qty 1

## 2022-08-30 MED ORDER — ACETAMINOPHEN 500 MG PO TABS
1000.0000 mg | ORAL_TABLET | Freq: Once | ORAL | Status: AC
  Administered 2022-08-30: 1000 mg via ORAL
  Filled 2022-08-30: qty 2

## 2022-08-30 MED ORDER — METHOCARBAMOL 500 MG PO TABS: 500.0000 mg | ORAL_TABLET | Freq: Four times a day (QID) | ORAL | 0 refills | Status: DC | PRN

## 2022-08-30 NOTE — ED Provider Notes (Signed)
[facility] EMERGENCY DEPT Provider Note   CSN: [id number] Arrival date & time: 08/30/22  1327     History  Chief Complaint  Patient presents with   Shoulder Pain    Todd Myers is a 81 y.o. male.  HPI Patient reports 2 days ago he had pain in the left side of his neck that radiated towards his shoulder from the trapezius.  He reports it hurt a lot if he tried to turn his head to the left and it exacerbated pain going into the trapezius area and under the shoulder blade.  He took some Tylenol and those symptoms improved.  He thought it was better but then yesterday pain started in the right side of the neck and again into the right trapezius and behind the shoulder blade.  Then it was significantly worse with turning the head or position changes.  He has been getting some relief with Tylenol but remains very uncomfortable.  No associated chest pain, no associated shortness of breath, no nausea, no lightheadedness.  He otherwise feels well.  No lower extremity symptoms of numbness or weakness.  Patient reports he did have a longer than typical drive recently he drove to [place] to visit family with his wife.  He had about a 6-hour drive and typically only drives in the car 1 to 2 hours max.    Home Medications Prior to Admission medications   Medication Sig Start Date End Date Taking? Authorizing Provider  docusate sodium (COLACE) 100 MG capsule Take 1 capsule (100 mg total) by mouth every 12 (twelve) hours. 08/30/22  Yes Charlesetta Shanks, MD  methocarbamol (ROBAXIN) 500 MG tablet Take 1 tablet (500 mg total) by mouth every 6 (six) hours as needed for muscle spasms. 08/30/22  Yes Charlesetta Shanks, MD  oxyCODONE-acetaminophen (PERCOCET) 5-325 MG tablet Take 1 tablet by mouth every 6 (six) hours as needed for moderate pain. 08/30/22  Yes Charlesetta Shanks, MD  Ca Carbonate-Mag Hydroxide (ROLAIDS PO) Take by mouth as needed.    [provider]  cephALEXin (KEFLEX) 500 MG  capsule Take 1 capsule (500 mg total) by mouth 3 (three) times daily. 10/24/21   Trula Slade, DPM  Cholecalciferol 25 MCG (1000 UT) tablet Take 1,000 Units by mouth daily.     [provider]  irbesartan (AVAPRO) 150 MG tablet Take 150 mg by mouth daily.  03/29/14   [provider]  meloxicam (MOBIC) 15 MG tablet Take 1 tablet (15 mg total) by mouth daily as needed for pain. 11/01/18   Shuford, Olivia Mackie, PA-C  methocarbamol (ROBAXIN) 500 MG tablet Take 1 tablet (500 mg total) by mouth 3 (three) times daily as needed for muscle spasms. 11/01/18   Shuford, Olivia Mackie, PA-C  oxyCODONE-acetaminophen (PERCOCET) 5-325 MG tablet Take 1 tablet by mouth every 4 (four) hours as needed (max 6 q). 11/01/18   Shuford, Olivia Mackie, PA-C  Polyethyl Glycol-Propyl Glycol (SYSTANE OP) Place 1 drop into both eyes daily as needed (dry eyes).    [provider]  simvastatin (ZOCOR) 20 MG tablet Take 20 mg by mouth every evening.  04/15/14   [provider]      Allergies    Sulfa antibiotics    Review of Systems   Review of Systems  Physical Exam Updated Vital Signs BP 134/70 (BP Location: Right Arm)   Pulse 62   Temp 98.3 F (36.8 C) (Oral)   Resp 18   Ht '5\' 3"'$  (1.6 m)   Wt 61.5 kg  SpO2 100%   BMI 24.02 kg/m  Physical Exam Constitutional:      Comments: Alert and nontoxic.  Clinically well in appearance.  Well-nourished well-developed.  HENT:     Head: Normocephalic and atraumatic.     Mouth/Throat:     Pharynx: Oropharynx is clear.  Eyes:     Extraocular Movements: Extraocular movements intact.  Neck:     Comments: Patient has significantly reproducible pain in the paraspinous muscle bodies at the base of the cervical spine and tracking into the trapezius as well as the subscapularis.  Is most pronounced on the right.  He however also has discomfort at the paraspinous muscle bodies at about C6 and T1 and subscapularis on the left.  Both upper extremities are  symmetric. Cardiovascular:     Rate and Rhythm: Normal rate and regular rhythm.  Pulmonary:     Effort: Pulmonary effort is normal.     Breath sounds: Normal breath sounds.  Musculoskeletal:     Comments: Bilateral upper extremities symmetric.  Warm and dry.  Good grip strength bilaterally.  Skin:    General: Skin is warm and dry.  Neurological:     General: No focal deficit present.     Mental Status: He is oriented to person, place, and time.     Motor: No weakness.     Coordination: Coordination normal.  Psychiatric:        Mood and Affect: Mood normal.     ED Results / Procedures / Treatments   Labs (all labs ordered are listed, but only abnormal results are displayed) Labs Reviewed - No data to display  EKG None  Radiology No results found.  Procedures Procedures    Medications Ordered in ED Medications  acetaminophen (TYLENOL) tablet 1,000 mg (has no administration in time range)  methocarbamol (ROBAXIN) tablet 500 mg (has no administration in time range)    ED Course/ Medical Decision Making/ A&P                           Medical Decision Making  Patient is alert nontoxic and well in appearance.  Vital signs are stable.  He has had neck pain over the past 3 days that is first on the left and now the right.  Pain radiates into the trapezius and subscapularis.  He does not have any chest pain any shortness of breath or lightheadedness to suggest cardiopulmonary etiology.  At this time I have very low suspicion for dissection\ACS\pneumonia\PE.  Pain is reproducible and there is no associated neurologic dysfunction.  He has excellent upper extremity strength without subjective symptoms of weakness or numbness.  History is positive for a recent drive of 6 hours which is atypical for him.  Suspect there is some muscle spasm in association with likely cervical spine arthritic changes.  We have extensively reviewed a plan for pain management at home.  This is completely  outlined in the discharge instructions.  Has tolerated Percocet in the past after a right shoulder surgery about 3 years ago.  We reviewed a stepwise plan for pain control.  We also reviewed the immediate necessary return symptoms.  Has excellent mental status and appropriate for continued home care and follow-up.        Final Clinical Impression(s) / ED Diagnoses Final diagnoses:  Neck pain on right side  Muscle spasm    Rx / DC Orders ED Discharge Orders  Ordered    oxyCODONE-acetaminophen (PERCOCET) 5-325 MG tablet  Every 6 hours PRN        08/30/22 1626    methocarbamol (ROBAXIN) 500 MG tablet  Every 6 hours PRN        08/30/22 1626    docusate sodium (COLACE) 100 MG capsule  Every 12 hours        08/30/22 1626              Charlesetta Shanks, MD 08/30/22 1643

## 2022-08-30 NOTE — Discharge Instructions (Addendum)
1.  Your neck and upper back pain and is likely due to a pinched nerve from your neck or upper back.  Often people have some arthritis in their neck and back.  You recently had a long drive and probably are having some muscle spasm in association with irritation and inflammation of the disc or joints in the neck and upper back.  At this time, you DO NOT have any symptoms of spinal cord compression or significant nerve compression causing problems into the arm or with arm function.  If you develop problems like numbness or weakness into the arms, you must have immediate recheck.  This would be a sign that you are having possibly significant compression of nerves that might require surgical treatment.  It is not uncommon to get neck and upper back pain after new activities such as a long drive. 2.  For your baseline pain control you have several options.      If effective, you may take extra strength Tylenol alone every 6 hours.  For additional control of muscle spasm you may add a dose of Robaxin every 6 hours.      You may take 1 Percocet tablet with 1 regular strength Tylenol tablet every 6 hours for pain control if the above regimen is not effective.      Take 1 Percocet tablet and 1 regular strength Tylenol tablet as well as 1 Robaxin tablet every 6 hours if needed.  Pay special attention to any problems with dizziness, confusion or incoordination with this combination of medication.      If you tend to have any problems with constipation, take Colace twice daily in addition to your regular Metamucil.  Narcotic pain medication such as Percocet can cause constipation.  3.  Schedule a follow-up with your doctor within the next 3 days to see if your pain is appropriately controlled or if you need any additional evaluation.  4.  Return to the emergency department if you find that you are getting numbness in your arms, weakness causing dysfunction in the arms or legs or any other concerning changes.   Return immediately if you develop chest pain, shortness of breath, nausea, or lightheadedness.

## 2022-08-30 NOTE — ED Triage Notes (Signed)
Patient here POV from Home.  Endorses Right Shoulder Pain that began Monday and has worsened since. States originally it was his Left Shoulder and left Neck that was aching over the weekend but that has since subsided and now is only Right Neck and Shoulder.   No Known Trauma or Injury. Very Painful to move.   NAD Noted during Triage. A&Ox4. GCS 15. Ambulatory.

## 2022-08-31 DIAGNOSIS — M542 Cervicalgia: Secondary | ICD-10-CM | POA: Diagnosis not present

## 2022-11-24 DIAGNOSIS — I10 Essential (primary) hypertension: Secondary | ICD-10-CM | POA: Diagnosis not present

## 2022-11-24 DIAGNOSIS — E785 Hyperlipidemia, unspecified: Secondary | ICD-10-CM | POA: Diagnosis not present

## 2022-11-24 DIAGNOSIS — Z125 Encounter for screening for malignant neoplasm of prostate: Secondary | ICD-10-CM | POA: Diagnosis not present

## 2022-11-24 DIAGNOSIS — R7989 Other specified abnormal findings of blood chemistry: Secondary | ICD-10-CM | POA: Diagnosis not present

## 2022-11-30 DIAGNOSIS — Z1339 Encounter for screening examination for other mental health and behavioral disorders: Secondary | ICD-10-CM | POA: Diagnosis not present

## 2022-11-30 DIAGNOSIS — Z0000 Encounter for general adult medical examination without abnormal findings: Secondary | ICD-10-CM | POA: Diagnosis not present

## 2022-11-30 DIAGNOSIS — N401 Enlarged prostate with lower urinary tract symptoms: Secondary | ICD-10-CM | POA: Diagnosis not present

## 2022-11-30 DIAGNOSIS — M8580 Other specified disorders of bone density and structure, unspecified site: Secondary | ICD-10-CM | POA: Diagnosis not present

## 2022-11-30 DIAGNOSIS — Z23 Encounter for immunization: Secondary | ICD-10-CM | POA: Diagnosis not present

## 2022-11-30 DIAGNOSIS — F17201 Nicotine dependence, unspecified, in remission: Secondary | ICD-10-CM | POA: Diagnosis not present

## 2022-11-30 DIAGNOSIS — I10 Essential (primary) hypertension: Secondary | ICD-10-CM | POA: Diagnosis not present

## 2022-11-30 DIAGNOSIS — Z1331 Encounter for screening for depression: Secondary | ICD-10-CM | POA: Diagnosis not present

## 2022-11-30 DIAGNOSIS — R972 Elevated prostate specific antigen [PSA]: Secondary | ICD-10-CM | POA: Diagnosis not present

## 2022-11-30 DIAGNOSIS — H9319 Tinnitus, unspecified ear: Secondary | ICD-10-CM | POA: Diagnosis not present

## 2022-11-30 DIAGNOSIS — E785 Hyperlipidemia, unspecified: Secondary | ICD-10-CM | POA: Diagnosis not present

## 2022-11-30 DIAGNOSIS — M542 Cervicalgia: Secondary | ICD-10-CM | POA: Diagnosis not present

## 2022-11-30 DIAGNOSIS — R82998 Other abnormal findings in urine: Secondary | ICD-10-CM | POA: Diagnosis not present

## 2023-01-31 DIAGNOSIS — R8289 Other abnormal findings on cytological and histological examination of urine: Secondary | ICD-10-CM | POA: Diagnosis not present

## 2023-01-31 DIAGNOSIS — R310 Gross hematuria: Secondary | ICD-10-CM | POA: Diagnosis not present

## 2023-01-31 DIAGNOSIS — D414 Neoplasm of uncertain behavior of bladder: Secondary | ICD-10-CM | POA: Diagnosis not present

## 2023-02-06 ENCOUNTER — Other Ambulatory Visit: Payer: Self-pay | Admitting: Urology

## 2023-02-09 ENCOUNTER — Encounter (HOSPITAL_COMMUNITY): Payer: Self-pay

## 2023-02-23 NOTE — Progress Notes (Signed)
COVID Vaccine received:  []  No [x]  Yes Date of any COVID positive Test in last 34 days:No  PCP - Dr. Martha Clan Cardiologist - No  Chest x-ray - No EKG -  02/26/23 EPIC Stress Test - greater than 15 years ECHO - No Cardiac Cath - No  Bowel Prep - [x]  No  []   Yes ______  Pacemaker / ICD device [x]  No []  Yes   Spinal Cord Stimulator:[x]  No []  Yes       History of Sleep Apnea? [x]  No []  Yes   CPAP used?- []  No []  Yes    Does the patient monitor blood sugar?          [x]  No []  Yes  []  N/A  Patient has: [x]  NO Hx DM   []  Pre-DM                 []  DM1  []   DM2 Does patient have a [employer] or Dexacom? [x]  No []  Yes   Fasting Blood Sugar Ranges-  Checks Blood Sugar _____ times a day  GLP1 agonist / usual dose - No GLP1 instructions:  SGLT-2 inhibitors / usual dose - No SGLT-2 instructions:   Blood Thinner / Instructions:No Aspirin Instructions:No  Comments:   Activity level: Patient is able  to climb a flight of stairs without difficulty; [x]  No CP  [x]  No SOB   Patient canperform ADLs without assistance.   Anesthesia review:   Patient denies shortness of breath, fever, cough and chest pain at PAT appointment.  Patient verbalized understanding and agreement to the Pre-Surgical Instructions that were given to them at this PAT appointment. Patient was also educated of the need to review these PAT instructions again prior to his/her surgery.I reviewed the appropriate phone numbers to call if they have any and questions or concerns.

## 2023-02-23 NOTE — Patient Instructions (Signed)
SURGICAL WAITING ROOM VISITATION  Patients having surgery or a procedure may have no more than 2 support people in the waiting area - these visitors may rotate.    Children under the age of 89 must have an adult with them who is not the patient.  Due to an increase in RSV and influenza rates and associated hospitalizations, children ages 48 and under may not visit patients in [facility] hospitals.  If the patient needs to stay at the hospital during part of their recovery, the visitor guidelines for inpatient rooms apply. Pre-op nurse will coordinate an appropriate time for 1 support person to accompany patient in pre-op.  This support person may not rotate.    Please refer to the [facility] website for the visitor guidelines for Inpatients (after your surgery is over and you are in a regular room).       Your procedure is scheduled on: 03/08/23   Report to [facility] Main Entrance    Report to admitting at  1 PM   Call this number if you have problems the morning of surgery [phone]   Do not eat food  or drink liquids :After Midnight.    .     Oral Hygiene is also important to reduce your risk of infection.                                    Remember - BRUSH YOUR TEETH THE MORNING OF SURGERY WITH YOUR REGULAR TOOTHPASTE  DENTURES WILL BE REMOVED PRIOR TO SURGERY PLEASE DO NOT APPLY "Poly grip" OR ADHESIVES!!!   Do NOT smoke after Midnight   Take these medicines the morning of surgery with A SIP OF WATER: Tylenol, Famotidine(Pepcid)             You may not have any metal on your body including hair pins, jewelry, and body piercing             Do not wear make-up, lotions, powders, cologne, or deodorant               Men may shave face and neck.   Do not bring valuables to the hospital. [facility] IS NOT             RESPONSIBLE   FOR VALUABLES.   Contacts, glasses, dentures or bridgework may not be worn into surgery.  DO NOT BRING YOUR HOME MEDICATIONS  TO THE HOSPITAL. PHARMACY WILL DISPENSE MEDICATIONS LISTED ON YOUR MEDICATION LIST TO YOU DURING YOUR ADMISSION IN THE HOSPITAL!    Patients discharged on the day of surgery will not be allowed to drive home.  Someone NEEDS to stay with you for the first 24 hours after anesthesia.   Special Instructions: Bring a copy of your healthcare power of attorney and living will documents the day of surgery if you haven't scanned them before.              Please read over the following fact sheets you were given: IF YOU HAVE QUESTIONS ABOUT YOUR PRE-OP INSTRUCTIONS PLEASE CALL [phone]  If you received a COVID test during your pre-op visit  it is requested that you wear a mask when out in public, stay away from anyone that may not be feeling well and notify your surgeon if you develop symptoms. If you test positive for Covid or have been in contact with anyone that has tested positive  in the last 10 days please notify you surgeon.    [facility] - Preparing for Surgery Before surgery, you can play an important role.  Because skin is not sterile, your skin needs to be as free of germs as possible.  You can reduce the number of germs on your skin by washing with CHG (chlorahexidine gluconate) soap before surgery.  CHG is an antiseptic cleaner which kills germs and bonds with the skin to continue killing germs even after washing. Please DO NOT use if you have an allergy to CHG or antibacterial soaps.  If your skin becomes reddened/irritated stop using the CHG and inform your nurse when you arrive at Short Stay. Do not shave (including legs and underarms) for at least 48 hours prior to the first CHG shower.  You may shave your face/neck.  Please follow these instructions carefully:  1.  Shower with CHG Soap the night before surgery and the  morning of surgery.  2.  If you choose to wash your hair, wash your hair first as usual with your normal  shampoo.  3.  After you shampoo, rinse your hair and body  thoroughly to remove the shampoo.                             4.  Use CHG as you would any other liquid soap.  You can apply chg directly to the skin and wash.  Gently with a scrungie or clean washcloth.  5.  Apply the CHG Soap to your body ONLY FROM THE NECK DOWN.   Do   not use on face/ open                           Wound or open sores. Avoid contact with eyes, ears mouth and   genitals (private parts).                       Wash face,  Genitals (private parts) with your normal soap.             6.  Wash thoroughly, paying special attention to the area where your    surgery  will be performed.  7.  Thoroughly rinse your body with warm water from the neck down.  8.  DO NOT shower/wash with your normal soap after using and rinsing off the CHG Soap.                9.  Pat yourself dry with a clean towel.            10.  Wear clean pajamas.            11.  Place clean sheets on your bed the night of your first shower and do not  sleep with pets. Day of Surgery : Do not apply any lotions/deodorants the morning of surgery.  Please wear clean clothes to the hospital/surgery center.  FAILURE TO FOLLOW THESE INSTRUCTIONS MAY RESULT IN THE CANCELLATION OF YOUR SURGERY  PATIENT SIGNATURE_________________________________  NURSE SIGNATURE__________________________________  ________________________________________________________________________

## 2023-02-26 ENCOUNTER — Encounter (HOSPITAL_COMMUNITY)
Admission: RE | Admit: 2023-02-26 | Discharge: 2023-02-26 | Disposition: A | Discharge status: Home / Self Care | Payer: Medicare Other | Source: Ambulatory Visit | Attending: Urology | Admitting: Urology

## 2023-02-26 ENCOUNTER — Other Ambulatory Visit: Payer: Self-pay

## 2023-02-26 ENCOUNTER — Encounter (HOSPITAL_COMMUNITY): Payer: Self-pay

## 2023-02-26 VITALS — BP 136/82 | HR 63 | Temp 97.7°F | Resp 18 | Ht 62.0 in | Wt 127.0 lb

## 2023-02-26 DIAGNOSIS — I10 Essential (primary) hypertension: Secondary | ICD-10-CM | POA: Diagnosis not present

## 2023-02-26 DIAGNOSIS — Z01818 Encounter for other preprocedural examination: Secondary | ICD-10-CM | POA: Diagnosis not present

## 2023-02-26 HISTORY — DX: Malignant (primary) neoplasm, unspecified: C80.1

## 2023-02-26 LAB — BASIC METABOLIC PANEL
Anion gap: 9 (ref 5–15)
BUN: 19 mg/dL (ref 8–23)
CO2: 26 mmol/L (ref 22–32)
Calcium: 9.2 mg/dL (ref 8.9–10.3)
Chloride: 103 mmol/L (ref 98–111)
Creatinine, Ser: 0.87 mg/dL (ref 0.61–1.24)
GFR, Estimated: >60 mL/min (ref >60)
Glucose, Bld: 98 mg/dL (ref 70–99)
Potassium: 4.6 mmol/L (ref 3.5–5.1)
Sodium: 138 mmol/L (ref 135–145)

## 2023-02-26 LAB — CBC
HCT: 50 % (ref 39.0–52.0)
Hemoglobin: 16.9 g/dL (ref 13.0–17.0)
MCH: 32.6 pg (ref 26.0–34.0)
MCHC: 33.8 g/dL (ref 30.0–36.0)
MCV: 96.3 fL (ref 80.0–100.0)
Platelets: 267 10*3/uL (ref 150–400)
RBC: 5.19 MIL/uL (ref 4.22–5.81)
RDW: 12.1 % (ref 11.5–15.5)
WBC: 7.7 10*3/uL (ref 4.0–10.5)
nRBC: 0 % (ref 0.0–0.2)

## 2023-02-28 DIAGNOSIS — C679 Malignant neoplasm of bladder, unspecified: Secondary | ICD-10-CM | POA: Diagnosis not present

## 2023-02-28 DIAGNOSIS — R319 Hematuria, unspecified: Secondary | ICD-10-CM | POA: Diagnosis not present

## 2023-02-28 DIAGNOSIS — D414 Neoplasm of uncertain behavior of bladder: Secondary | ICD-10-CM | POA: Diagnosis not present

## 2023-02-28 DIAGNOSIS — N281 Cyst of kidney, acquired: Secondary | ICD-10-CM | POA: Diagnosis not present

## 2023-03-07 DIAGNOSIS — R42 Dizziness and giddiness: Secondary | ICD-10-CM | POA: Diagnosis not present

## 2023-03-07 DIAGNOSIS — I951 Orthostatic hypotension: Secondary | ICD-10-CM | POA: Diagnosis not present

## 2023-03-07 DIAGNOSIS — C679 Malignant neoplasm of bladder, unspecified: Secondary | ICD-10-CM | POA: Diagnosis not present

## 2023-03-07 DIAGNOSIS — I10 Essential (primary) hypertension: Secondary | ICD-10-CM | POA: Diagnosis not present

## 2023-03-07 NOTE — H&P (Signed)
Office Visit Report     01/31/2023   --------------------------------------------------------------------------------   Todd Myers  MRN: [id number]  DOB: [birth year], 82 year old Male  SSN: [id number]   PRIMARY CARE:  W Leola Brazil, MD  PRIMARY CARE FAX:  [phone]  REFERRING:  Azucena Kuba  PROVIDER:  Heloise Purpura, M.D.  LOCATION:  [facility] [phone]     --------------------------------------------------------------------------------   CC/HPI: Todd Myers hematuria   For full details, please see my prior note from 08/22/2022. Todd Myers presents today with a new complaint of gross hematuria. This occurred approximately 3 weeks ago when he had painless blood in his urine 1 time. This subsequently stopped. He had noted no other changes in his urination. In retrospect, he did have 1 episode similar to this about a year ago. He has denied any abdominal or flank pain, fever, nausea or vomiting. He did smoke 1 and half packs per day for 30 years but quit approximately 30 years ago.     ALLERGIES: Sulfa Drugs    MEDICATIONS: Cialis 5 mg tablet TAKE 1 TABLET BY MOUTH DAILY AS DIRECTED  Tadalafil 5 mg tablet 1 tablet PO as directed  Tadalafil 5 mg tablet 1 tablet PO Daily  Amoxicillin  Irbesartan 150 mg tablet Oral  Naproxen 500 mg tablet Oral  Simvastatin 20 mg tablet Oral  Vitamin D TABS Oral     GU PSH: No GU PSH      PSH Notes: Appendectomy, Shoulder Surgery, Ear Surgery, Hernia Repair   NON-GU PSH: Appendectomy - 2014 Hernia Repair - 2014     GU PMH: BPH w/LUTS - 08/22/2022, - 08/24/2021, - 2022, Benign prostatic hyperplasia (BPH) with straining on urination, - 2016 ED due to arterial insufficiency - 08/22/2022, - 08/24/2021, Erectile dysfunction due to arterial insufficiency, - 2016 Elevated PSA - 08/22/2022, - 08/24/2021, - 2022, Elevated prostate specific antigen (PSA), - 2016 Weak Urinary Stream - 08/22/2022, - 08/24/2021, -  2022, - 2017 Lower abdominal pain, unspecified, Inguinal pain - 2015 Renal calculus, Nephrolithiasis - 2014      PMH Notes:   1) Elevated PSA: His PSA has been as high as 5 and he has undergone a prior negative prostate biopsy by Dr. Aris [place] in [place] in 2012. He has no family history of prostate cancer.   Aug 2021: PSA 11.15 May 2020: MRI - No suspicious lesions, Vol 90 cc   2) Urolithiasis: He has a history of prior symptomatic urolithiasis.   3) BPH/LUTS: His symptoms are managed with alpha blocker therapy.   Current treatment: Cialis 5 mg 3 times weekly   4) Erectile dysfunction: He has been successfully treated with PDE-5 inhibitors.   Current treatment: Cialis 5 mg 3 times weekly     NON-GU PMH: Hypertension    FAMILY HISTORY: Acute Myocardial Infarction - Father Dementia - Mother Father Deceased At Age18 ___ - Mother Stroke Syndrome - Father   SOCIAL HISTORY: Marital Status: Married Preferred Language: English; Ethnicity: Not Hispanic Or Latino; Race: White Current Smoking Status: Patient does not smoke anymore.   Tobacco Use Assessment Completed: Used Tobacco in last 30 days? Does drink.  Drinks 1 caffeinated drink per day.     Notes: Former smoker, Marital History - Currently Married, Occupation: Retired, Alcohol Use   REVIEW OF SYSTEMS:    GU Review Male:   Patient denies frequent urination, hard to postpone urination, burning/ pain with urination, get up at night to urinate, leakage  of urine, stream starts and stops, trouble starting your streams, and have to strain to urinate .  Gastrointestinal (Lower):   Patient denies diarrhea and constipation.  Gastrointestinal (Upper):   Patient denies nausea and vomiting.  Constitutional:   Patient denies fever, night sweats, weight loss, and fatigue.  Skin:   Patient denies skin rash/ lesion and itching.  Eyes:   Patient denies blurred vision and double vision.  Ears/ Nose/ Throat:   Patient denies sore throat  and sinus problems.  Hematologic/Lymphatic:   Patient denies swollen glands and easy bruising.  Cardiovascular:   Patient denies leg swelling and chest pains.  Respiratory:   Patient denies cough and shortness of breath.  Endocrine:   Patient denies excessive thirst.  Musculoskeletal:   Patient denies back pain and joint pain.  Neurological:   Patient denies headaches and dizziness.  Psychologic:   Patient denies depression and anxiety.   VITAL SIGNS:      01/31/2023 01:30 PM  Weight 125 lb / 56.7 kg  Height 62 in / 157.48 cm  BP 135/81 mmHg  Pulse 64 /min  Temperature 97.8 F / 36.5 C  BMI 22.9 kg/m   GU PHYSICAL EXAMINATION:    Urethral Meatus: Normal size. No lesion, no wart, no discharge, no polyp. Normal location.   MULTI-SYSTEM PHYSICAL EXAMINATION:    Constitutional: Well-nourished. No physical deformities. Normally developed. Good grooming.  Respiratory: No labored breathing, no use of accessory muscles.   Cardiovascular: Normal temperature, normal extremity pulses, no swelling, no varicosities.  Gastrointestinal: No mass, no tenderness, no rigidity, non obese abdomen. No CVA tenderness.     Complexity of Data:  Records Review:   Previous Patient Records   08/15/22 08/17/21 12/07/20 07/12/20 04/21/20 10/14/19 09/10/18 08/16/18  PSA  Total PSA 7.68 ng/mL 6.36 ng/mL 7.97 ng/mL 9.45 ng/mL 11.40 ng/mL 6.032 ng/dl 1.61 ng/mL 0.96 ng/dl  Free PSA 0.45 ng/mL 4.09 ng/mL  1.27 ng/mL      % Free PSA 15 % PSA 17 % PSA  13 % PSA        PROCEDURES:         Flexible Cystoscopy - 52000  Indication: Gross hematuria Risks, benefits, and potential complications of the procedure were discussed with the patient including infection, bleeding, voiding discomfort, urinary retention, fever, chills, sepsis, and others. All questions were answered. Informed consent was obtained. Sterile technique and intraurethral analgesia were used.  Meatus:  Normal size. Normal location. Normal condition.   Urethra:  No strictures.  External Sphincter:  Normal.  Verumontanum:  Normal.  Prostate:  Moderate hyperplasia. Non-obstructing.  Bladder Neck:  Non-obstructing.  Ureteral Orifices:  Normal location. Normal size. Normal shape. Effluxed clear urine.  Bladder:  Systematic examination of bladder was performed. There was noted to be a frondular tumor located along the right lateral bladder wall just inside the bladder neck. There is some papillary changes along the trigone as well and significant hypervascularity extending back toward the right lateral and posterior aspect of the bladder. A bladder washing was obtained for cytology. Complete visualization was somewhat obscured due to hematuria.      Chaperone: SM The procedure was well-tolerated and without complications. Instructions were given to call the office immediately if questions or problems.         Urinalysis w/Scope Dipstick Dipstick Cont'd Micro  Color: Yellow Bilirubin: Neg mg/dL WBC/hpf: NS (Not Seen)  Appearance: Clear Ketones: Neg mg/dL RBC/hpf: 0 - 2/hpf  Specific Gravity: 1.010 Blood: 1+ ery/uL Bacteria: Rare (  0-9/hpf)  pH: 7.0 Protein: Trace mg/dL Cystals: Amorph Phosphates  Glucose: Neg mg/dL Urobilinogen: 0.2 mg/dL Casts: NS (Not Seen)    Nitrites: Neg Trichomonas: Not Present    Leukocyte Esterase: Neg leu/uL Mucous: Not Present      Epithelial Cells: NS (Not Seen)      Yeast: NS (Not Seen)      Sperm: Not Present    ASSESSMENT:      ICD-10 Details  1 GU:   Gross hematuria - R31.0   2   Bladder tumor/neoplasm - D41.4    PLAN:           Orders Labs Urine Cytology, CMP, CBC with Diff  Lab Notes: Washing  X-Rays: C.T. Hematuria With and Without I.V. Contrast          Schedule Return Visit/Planned Activity: Other See Visit Notes             Note: Will call to schedule surgery.          Document Letter(s):  Created for Patient: Clinical Summary         Notes:   1. Bladder tumor: He does appear to have  a likely malignant bladder tumor. Bladder washing has been obtained for cytology today. He will proceed with a hematuria protocol CT scan after his renal function is checked. I recommended that he proceed with cystoscopy, possible bilateral retrograde pyelography (pending his CT scan results), pelvic exam under anesthesia, and transurethral resection of his bladder tumor. We have reviewed this procedure in detail including the potential risks, complications, and expected recovery process. He gives informed consent to proceed.   CC: Dr. Eric Form        Next Appointment:      Next Appointment: 03/05/2023 01:15 PM    Appointment Type: CT Scan    Location: [facility] [phone]    Provider: CT CT    Reason for Visit: CT Hematuria with and without/Gross Hematuria/Jyren Cerasoli      * Signed by Heloise Purpura, M.D. on 02/01/23 at 11:12 AM (EDT)*

## 2023-03-08 ENCOUNTER — Other Ambulatory Visit: Payer: Self-pay

## 2023-03-08 ENCOUNTER — Ambulatory Visit (HOSPITAL_BASED_OUTPATIENT_CLINIC_OR_DEPARTMENT_OTHER): Payer: Medicare Other | Admitting: Anesthesiology

## 2023-03-08 ENCOUNTER — Encounter (HOSPITAL_COMMUNITY): Payer: Self-pay | Admitting: Urology

## 2023-03-08 ENCOUNTER — Encounter (HOSPITAL_COMMUNITY)
Admission: RE | Disposition: A | Discharge status: Home / Self Care | Payer: Self-pay | Source: Ambulatory Visit | Attending: Urology

## 2023-03-08 ENCOUNTER — Ambulatory Visit (HOSPITAL_COMMUNITY): Payer: Medicare Other | Admitting: Anesthesiology

## 2023-03-08 ENCOUNTER — Ambulatory Visit (HOSPITAL_COMMUNITY)
Admission: RE | Admit: 2023-03-08 | Discharge: 2023-03-08 | Disposition: A | Discharge status: Home / Self Care | Payer: Medicare Other | Source: Ambulatory Visit | Attending: Urology | Admitting: Urology

## 2023-03-08 DIAGNOSIS — I10 Essential (primary) hypertension: Secondary | ICD-10-CM | POA: Insufficient documentation

## 2023-03-08 DIAGNOSIS — M1990 Unspecified osteoarthritis, unspecified site: Secondary | ICD-10-CM | POA: Diagnosis not present

## 2023-03-08 DIAGNOSIS — R310 Gross hematuria: Secondary | ICD-10-CM | POA: Diagnosis not present

## 2023-03-08 DIAGNOSIS — K219 Gastro-esophageal reflux disease without esophagitis: Secondary | ICD-10-CM | POA: Insufficient documentation

## 2023-03-08 DIAGNOSIS — Z87891 Personal history of nicotine dependence: Secondary | ICD-10-CM

## 2023-03-08 DIAGNOSIS — C679 Malignant neoplasm of bladder, unspecified: Secondary | ICD-10-CM | POA: Diagnosis not present

## 2023-03-08 DIAGNOSIS — C672 Malignant neoplasm of lateral wall of bladder: Secondary | ICD-10-CM | POA: Insufficient documentation

## 2023-03-08 DIAGNOSIS — D494 Neoplasm of unspecified behavior of bladder: Secondary | ICD-10-CM | POA: Diagnosis not present

## 2023-03-08 DIAGNOSIS — D414 Neoplasm of uncertain behavior of bladder: Secondary | ICD-10-CM | POA: Diagnosis not present

## 2023-03-08 HISTORY — PX: TRANSURETHRAL RESECTION OF BLADDER TUMOR: SHX2575

## 2023-03-08 HISTORY — PX: CYSTOSCOPY: SHX5120

## 2023-03-08 SURGERY — TURBT (TRANSURETHRAL RESECTION OF BLADDER TUMOR)
Anesthesia: General

## 2023-03-08 MED ORDER — PROMETHAZINE HCL 25 MG/ML IJ SOLN: 6.2500 mg | INTRAMUSCULAR | Status: DC | PRN

## 2023-03-08 MED ORDER — OXYCODONE HCL 5 MG PO TABS: 5.0000 mg | ORAL_TABLET | Freq: Once | ORAL | Status: DC | PRN

## 2023-03-08 MED ORDER — HYDROMORPHONE HCL 1 MG/ML IJ SOLN: 0.2500 mg | INTRAMUSCULAR | Status: DC | PRN

## 2023-03-08 MED ORDER — SUGAMMADEX SODIUM 500 MG/5ML IV SOLN
INTRAVENOUS | Status: DC | PRN
  Administered 2023-03-08: 400 mg via INTRAVENOUS

## 2023-03-08 MED ORDER — PROPOFOL 10 MG/ML IV BOLUS
INTRAVENOUS | Status: DC | PRN
  Administered 2023-03-08: 120 mg via INTRAVENOUS

## 2023-03-08 MED ORDER — LIDOCAINE HCL (CARDIAC) PF 100 MG/5ML IV SOSY
PREFILLED_SYRINGE | INTRAVENOUS | Status: DC | PRN
  Administered 2023-03-08: 80 mg via INTRAVENOUS

## 2023-03-08 MED ORDER — AMISULPRIDE (ANTIEMETIC) 5 MG/2ML IV SOLN: 10.0000 mg | Freq: Once | INTRAVENOUS | Status: DC | PRN

## 2023-03-08 MED ORDER — PROPOFOL 10 MG/ML IV BOLUS
INTRAVENOUS | Status: AC
  Filled 2023-03-08: qty 20

## 2023-03-08 MED ORDER — EPHEDRINE SULFATE (PRESSORS) 50 MG/ML IJ SOLN
INTRAMUSCULAR | Status: DC | PRN
  Administered 2023-03-08: 10 mg via INTRAVENOUS

## 2023-03-08 MED ORDER — CHLORHEXIDINE GLUCONATE 0.12 % MT SOLN
15.0000 mL | Freq: Once | OROMUCOSAL | Status: AC
  Administered 2023-03-08: 15 mL via OROMUCOSAL

## 2023-03-08 MED ORDER — PHENAZOPYRIDINE HCL 200 MG PO TABS: 200.0000 mg | ORAL_TABLET | Freq: Three times a day (TID) | ORAL | 0 refills | Status: DC | PRN

## 2023-03-08 MED ORDER — CEFAZOLIN SODIUM-DEXTROSE 2-4 GM/100ML-% IV SOLN
2.0000 g | INTRAVENOUS | Status: AC
  Administered 2023-03-08: 2 g via INTRAVENOUS
  Filled 2023-03-08: qty 100

## 2023-03-08 MED ORDER — DEXAMETHASONE SODIUM PHOSPHATE 10 MG/ML IJ SOLN
INTRAMUSCULAR | Status: DC | PRN
  Administered 2023-03-08: 5 mg via INTRAVENOUS

## 2023-03-08 MED ORDER — ONDANSETRON HCL 4 MG/2ML IJ SOLN
INTRAMUSCULAR | Status: DC | PRN
  Administered 2023-03-08: 4 mg via INTRAVENOUS

## 2023-03-08 MED ORDER — SUCCINYLCHOLINE CHLORIDE 200 MG/10ML IV SOSY
PREFILLED_SYRINGE | INTRAVENOUS | Status: DC | PRN
  Administered 2023-03-08: 100 mg via INTRAVENOUS

## 2023-03-08 MED ORDER — FENTANYL CITRATE (PF) 100 MCG/2ML IJ SOLN
INTRAMUSCULAR | Status: DC | PRN
  Administered 2023-03-08: 100 ug via INTRAVENOUS

## 2023-03-08 MED ORDER — FENTANYL CITRATE (PF) 100 MCG/2ML IJ SOLN
INTRAMUSCULAR | Status: AC
  Filled 2023-03-08: qty 2

## 2023-03-08 MED ORDER — SODIUM CHLORIDE 0.9 % IR SOLN
Status: DC | PRN
  Administered 2023-03-08: 15000 mL

## 2023-03-08 MED ORDER — LACTATED RINGERS IV SOLN: INTRAVENOUS | Status: DC

## 2023-03-08 MED ORDER — ORAL CARE MOUTH RINSE: 15.0000 mL | Freq: Once | OROMUCOSAL | Status: AC

## 2023-03-08 MED ORDER — OXYCODONE HCL 5 MG/5ML PO SOLN: 5.0000 mg | Freq: Once | ORAL | Status: DC | PRN

## 2023-03-08 MED ORDER — 0.9 % SODIUM CHLORIDE (POUR BTL) OPTIME
TOPICAL | Status: DC | PRN
  Administered 2023-03-08: 1000 mL

## 2023-03-08 MED ORDER — ROCURONIUM BROMIDE 100 MG/10ML IV SOLN
INTRAVENOUS | Status: DC | PRN
  Administered 2023-03-08: 50 mg via INTRAVENOUS

## 2023-03-08 SURGICAL SUPPLY — 19 items
BAG DRN RND TRDRP ANRFLXCHMBR (UROLOGICAL SUPPLIES)
BAG URINE DRAIN 2000ML AR STRL (UROLOGICAL SUPPLIES) IMPLANT
BAG URO CATCHER STRL LF (MISCELLANEOUS) ×3 IMPLANT
CATH URETL OPEN END 6FR 70 (CATHETERS) ×1 IMPLANT
CLOTH BEACON ORANGE TIMEOUT ST (SAFETY) ×3 IMPLANT
DRAPE FOOT SWITCH (DRAPES) ×3 IMPLANT
ELECT REM PT RETURN 15FT ADLT (MISCELLANEOUS) ×2 IMPLANT
GLOVE SURG LX STRL 7.5 STRW (GLOVE) ×3 IMPLANT
GOWN STRL REUS W/ TWL XL LVL3 (GOWN DISPOSABLE) ×3 IMPLANT
GOWN STRL REUS W/TWL XL LVL3 (GOWN DISPOSABLE) ×3
GUIDEWIRE STR DUAL SENSOR (WIRE) ×3 IMPLANT
KIT TURNOVER KIT A (KITS) IMPLANT
LOOP CUT BIPOLAR 24F LRG (ELECTROSURGICAL) ×1 IMPLANT
MANIFOLD NEPTUNE II (INSTRUMENTS) ×3 IMPLANT
PACK CYSTO (CUSTOM PROCEDURE TRAY) ×3 IMPLANT
SYR TOOMEY IRRIG 70ML (MISCELLANEOUS) ×3
SYRINGE TOOMEY IRRIG 70ML (MISCELLANEOUS) ×1 IMPLANT
TUBING CONNECTING 10 (TUBING) ×3 IMPLANT
TUBING UROLOGY SET (TUBING) ×3 IMPLANT

## 2023-03-08 NOTE — Interval H&P Note (Signed)
History and Physical Interval Note:  03/08/2023 2:14 PM  Todd Myers  has presented today for surgery, with the diagnosis of BLADDER TUMOR.  The various methods of treatment have been discussed with the patient and family. After consideration of risks, benefits and other options for treatment, the patient has consented to  Procedure(s) with comments: TRANSURETHRAL RESECTION OF BLADDER TUMOR (TURBT) (N/A) - 60 MINUTES NEEDED FOR CASE CYSTOSCOPY WITH POSSIBLE BILATERAL RETROGRADE PYELOGRAM (Bilateral) as a surgical intervention.  The patient's history has been reviewed, patient examined, no change in status, stable for surgery.  I have reviewed the patient's chart and labs.  Questions were answered to the patient's satisfaction.     [employer]

## 2023-03-08 NOTE — Anesthesia Preprocedure Evaluation (Addendum)
Anesthesia Evaluation  Patient identified by MRN, date of birth, ID band Patient awake    Reviewed: Allergy & Precautions, H&P , NPO status , Patient's Chart, lab work & pertinent test results, reviewed documented beta blocker date and time   Airway Mallampati: II  TM Distance: >3 FB Neck ROM: full    Dental no notable dental hx. (+) Teeth Intact   Pulmonary former smoker   Pulmonary exam normal breath sounds clear to auscultation       Cardiovascular Exercise Tolerance: Good hypertension, Pt. on medications  Rhythm:regular Rate:Normal     Neuro/Psych negative neurological ROS  negative psych ROS   GI/Hepatic Neg liver ROS,GERD  ,,  Endo/Other  negative endocrine ROS    Renal/GU negative Renal ROS  negative genitourinary   Musculoskeletal  (+) Arthritis , Osteoarthritis,    Abdominal   Peds  Hematology negative hematology ROS (+)   Anesthesia Other Findings   Reproductive/Obstetrics negative OB ROS                             Anesthesia Physical Anesthesia Plan  ASA: 3  Anesthesia Plan: General   Post-op Pain Management: GA combined w/ Regional for post-op pain and Dilaudid IV   Induction: Intravenous  PONV Risk Score and Plan: 2 and Treatment may vary due to age or medical condition, Ondansetron and Midazolam  Airway Management Planned: Oral ETT  Additional Equipment:   Intra-op Plan:   Post-operative Plan: Extubation in OR  Informed Consent: I have reviewed the patients History and Physical, chart, labs and discussed the procedure including the risks, benefits and alternatives for the proposed anesthesia with the patient or authorized representative who has indicated his/her understanding and acceptance.     Dental Advisory Given  Plan Discussed with: CRNA, Anesthesiologist and Surgeon  Anesthesia Plan Comments:         Anesthesia Quick Evaluation

## 2023-03-08 NOTE — Transfer of Care (Signed)
Immediate Anesthesia Transfer of Care Note  Patient: Todd Myers  Procedure(s) Performed: TRANSURETHRAL RESECTION OF BLADDER TUMOR (TURBT) CYSTOSCOPY  Patient Location: PACU  Anesthesia Type:General  Level of Consciousness: awake, alert , oriented, and patient cooperative  Airway & Oxygen Therapy: Patient Spontanous Breathing and Patient connected to face mask oxygen  Post-op Assessment: Report given to RN, Post -op Vital signs reviewed and stable, and Patient moving all extremities X 4  Post vital signs: Reviewed and stable  Last Vitals:  Vitals Value Taken Time  BP 156/68 03/08/23 1640  Temp    Pulse 64 03/08/23 1642  Resp 16 03/08/23 1642  SpO2 99 % 03/08/23 1642  Vitals shown include unvalidated device data.  Last Pain:  Vitals:   03/08/23 1320  TempSrc: Oral  PainSc:          Complications:  Encounter Notable Events  Notable Event Outcome Phase Comment  Difficult to intubate - unexpected  Intraprocedure Filed from anesthesia note documentation.

## 2023-03-08 NOTE — Anesthesia Postprocedure Evaluation (Signed)
Anesthesia Post Note  Patient: Todd Myers  Procedure(s) Performed: TRANSURETHRAL RESECTION OF BLADDER TUMOR (TURBT) CYSTOSCOPY     Patient location during evaluation: PACU Anesthesia Type: General Level of consciousness: awake and alert Pain management: pain level controlled Vital Signs Assessment: post-procedure vital signs reviewed and stable Respiratory status: spontaneous breathing, nonlabored ventilation and respiratory function stable Cardiovascular status: blood pressure returned to baseline and stable Postop Assessment: no apparent nausea or vomiting Anesthetic complications: yes   Encounter Notable Events  Notable Event Outcome Phase Comment  Difficult to intubate - unexpected  Intraprocedure Filed from anesthesia note documentation.    Last Vitals:  Vitals:   03/08/23 1645 03/08/23 1700  BP: (!) 161/82 (!) 157/79  Pulse: 71 70  Resp: 15 14  Temp:    SpO2: 91% 100%    Last Pain:  Vitals:   03/08/23 1700  TempSrc:   PainSc: 0-No pain                 Lowella Curb

## 2023-03-08 NOTE — Anesthesia Procedure Notes (Signed)
Procedure Name: Intubation Date/Time: 03/08/2023 3:43 PM  Performed by: Shanon Payor, CRNAPre-anesthesia Checklist: Patient identified, Emergency Drugs available, Suction available, Patient being monitored and Timeout performed Patient Re-evaluated:Patient Re-evaluated prior to induction Oxygen Delivery Method: Circle system utilized Preoxygenation: Pre-oxygenation with 100% oxygen Induction Type: IV induction Laryngoscope Size: Mac and 3 Grade View: Grade II Tube type: Oral Tube size: 7.0 mm Number of attempts: 2 Airway Equipment and Method: Bougie stylet Placement Confirmation: ETT inserted through vocal cords under direct vision, positive ETCO2, CO2 detector and breath sounds checked- equal and bilateral Secured at: 22 cm Tube secured with: Tape Dental Injury: Teeth and Oropharynx as per pre-operative assessment  Difficulty Due To: Difficulty was unanticipated

## 2023-03-08 NOTE — Op Note (Signed)
Preoperative diagnosis: Bladder tumor (3.5 cm)  Postoperative diagnosis:  Bladder tumor (3.5 cm)  Procedure:  Cystoscopy Transurethral resection of bladder tumor (3.5 cm) Right ureteral catheterization  Surgeon: Moody Bruins. M.D.  Anesthesia: General  Complications: None  Intraoperative findings:  Bladder tumor: 3.5 cm in papillary  EBL: Minimal  Specimens: Right lateral bladder tumor Base of bladder tumor  Disposition of specimens: Pathology  Indication: Todd Myers is a patient who was found to have a 3.5 cm right-sided papillary and multifocal bladder tumor. After reviewing the management options for treatment, he elected to proceed with the above surgical procedure(s). We have discussed the potential benefits and risks of the procedure, side effects of the proposed treatment, the likelihood of the patient achieving the goals of the procedure, and any potential problems that might occur during the procedure or recuperation. Informed consent has been obtained.  Upper tract imaging with a CT urogram was negative for upper tract abnormalities, hydronephrosis, or evidence of metastatic disease.  Description of procedure:  The patient was taken to the operating room and general anesthesia was induced.  The patient was placed in the dorsal lithotomy position, prepped and draped in the usual sterile fashion, and preoperative antibiotics were administered. A preoperative time-out was performed.   Cystourethroscopy was performed.  The patient's urethra was examined and demonstrated bilobar prostatic hypertrophy.   The bladder was then systematically examined in its entirety.  There was a large papillary bladder tumor measuring approximately 3.5 cm of the right lateral bladder wall extending toward the bladder neck and just down to the lateral aspect of the right ureteral orifice although this appeared to be unobstructed.  The bladder was then re-examined after the  resectoscope was placed.  The bladder tumor was 3.5 cm.  It was located right lateral bladder wall and appeared papillary. Using loop cautery resection, the entire tumor was resected and removed for permanent pathologic analysis.   Separate biopsies were also taken of the underlying deep bladder tissue and sent as a separate specimen.   Due to the proximity of the resection to the right ureteral tumor, 0.38 sensor guidewire was advanced up the right ureter along with a 6 French ureteral catheter.  This passed without difficulty and there was no evidence of ureteral injury.  Hemostasis was then achieved with the loop cautery and the bladder was emptied and reinspected with no further bleeding noted at the end of the procedure.    The bladder was then emptied and the procedure ended.  The patient appeared to tolerate the procedure well and without complications.  The patient was able to be awakened and transferred to the recovery unit in satisfactory condition.    Moody Bruins MD

## 2023-03-08 NOTE — Discharge Instructions (Signed)
You may see some blood in the urine and may have some burning with urination for 48-72 hours. You also may notice that you have to urinate more frequently or urgently after your procedure which is normal.  You should call should you develop an inability urinate, fever > 101, persistent nausea and vomiting that prevents you from eating or drinking to stay hydrated.    

## 2023-03-09 ENCOUNTER — Emergency Department (HOSPITAL_COMMUNITY)
Admission: EM | Admit: 2023-03-09 | Discharge: 2023-03-09 | Disposition: A | Discharge status: Home / Self Care | Payer: Medicare Other | Attending: Emergency Medicine | Admitting: Emergency Medicine

## 2023-03-09 ENCOUNTER — Encounter (HOSPITAL_COMMUNITY): Payer: Self-pay | Admitting: Emergency Medicine

## 2023-03-09 DIAGNOSIS — R140 Abdominal distension (gaseous): Secondary | ICD-10-CM | POA: Insufficient documentation

## 2023-03-09 DIAGNOSIS — M545 Low back pain, unspecified: Secondary | ICD-10-CM | POA: Insufficient documentation

## 2023-03-09 DIAGNOSIS — R339 Retention of urine, unspecified: Secondary | ICD-10-CM | POA: Diagnosis not present

## 2023-03-09 DIAGNOSIS — C679 Malignant neoplasm of bladder, unspecified: Secondary | ICD-10-CM | POA: Diagnosis not present

## 2023-03-09 LAB — CBC WITH DIFFERENTIAL/PLATELET
Abs Immature Granulocytes: 0.24 10*3/uL — ABNORMAL HIGH (ref 0.00–0.07)
Basophils Absolute: 0 10*3/uL (ref 0.0–0.1)
Basophils Relative: 0 %
Eosinophils Absolute: 0 10*3/uL (ref 0.0–0.5)
Eosinophils Relative: 0 %
HCT: 45.6 % (ref 39.0–52.0)
Hemoglobin: 15.6 g/dL (ref 13.0–17.0)
Immature Granulocytes: 2 %
Lymphocytes Relative: 6 %
Lymphs Abs: 0.9 10*3/uL (ref 0.7–4.0)
MCH: 32.4 pg (ref 26.0–34.0)
MCHC: 34.2 g/dL (ref 30.0–36.0)
MCV: 94.8 fL (ref 80.0–100.0)
Monocytes Absolute: 1 10*3/uL (ref 0.1–1.0)
Monocytes Relative: 7 %
Neutro Abs: 12.3 10*3/uL — ABNORMAL HIGH (ref 1.7–7.7)
Neutrophils Relative %: 85 %
Platelets: 237 10*3/uL (ref 150–400)
RBC: 4.81 MIL/uL (ref 4.22–5.81)
RDW: 12.1 % (ref 11.5–15.5)
WBC: 14.4 10*3/uL — ABNORMAL HIGH (ref 4.0–10.5)
nRBC: 0 % (ref 0.0–0.2)

## 2023-03-09 LAB — URINALYSIS, ROUTINE W REFLEX MICROSCOPIC
RBC / HPF: >50 RBC/hpf — ABNORMAL HIGH (ref 0–5)
Squamous Epithelial / HPF: NONE SEEN /HPF (ref 0–5)

## 2023-03-09 LAB — BASIC METABOLIC PANEL
Anion gap: 10 (ref 5–15)
BUN: 15 mg/dL (ref 8–23)
CO2: 21 mmol/L — ABNORMAL LOW (ref 22–32)
Calcium: 9.1 mg/dL (ref 8.9–10.3)
Chloride: 102 mmol/L (ref 98–111)
Creatinine, Ser: 0.97 mg/dL (ref 0.61–1.24)
GFR, Estimated: >60 mL/min (ref >60)
Glucose, Bld: 173 mg/dL — ABNORMAL HIGH (ref 70–99)
Potassium: 3.9 mmol/L (ref 3.5–5.1)
Sodium: 133 mmol/L — ABNORMAL LOW (ref 135–145)

## 2023-03-09 NOTE — ED Notes (Signed)
Returns to have leg bag placed.   Discharge instructions reviewed and questions answered.   Instructed to follow up with urology today. Alert, oriented and ambulatory on departure.

## 2023-03-09 NOTE — ED Provider Notes (Signed)
[place] EMERGENCY DEPARTMENT AT [facility] Provider Note   CSN: [id number] Arrival date & time: 03/09/23  4098     History  Chief Complaint  Patient presents with   Urinary Retention    Todd Myers is a 82 y.o. male.  Patient here with urinary retention.  Underwent cystoscopy today with resection of bladder tumor by Dr. Laverle Patter.  States he had a few dribbles after the surgery but no output from his bladder since midnight.  Feels full like he cannot urinate.  Having low back pain as well.  States he had a tumor resection is not sure if it is cancerous yet.  No blood thinner use.  No fever, chills, nausea, vomiting, chest pain or shortness of breath.  Denies any dizziness or lightheadedness.  Denies any blood in the urine or testicular pain.  The history is provided by the patient.       Home Medications Prior to Admission medications   Medication Sig Start Date End Date Taking? Authorizing Provider  acetaminophen (TYLENOL) 500 MG tablet Take 500 mg by mouth every 6 (six) hours as needed for moderate pain.    [provider]  calcium carbonate (TUMS - DOSED IN MG ELEMENTAL CALCIUM) 500 MG chewable tablet Chew 1 tablet by mouth daily.    [provider]  cholecalciferol (VITAMIN D3) 25 MCG (1000 UNIT) tablet Take 2,000 Units by mouth daily.    [provider]  cyclobenzaprine (FLEXERIL) 10 MG tablet Take 10 mg by mouth 3 (three) times daily as needed for muscle spasms.    [provider]  famotidine (PEPCID) 20 MG tablet Take 20 mg by mouth every other day. Takes with Cialis    [provider]  phenazopyridine (PYRIDIUM) 200 MG tablet Take 1 tablet (200 mg total) by mouth 3 (three) times daily as needed for pain. 03/08/23   Heloise Purpura, MD  Polyethyl Glycol-Propyl Glycol (SYSTANE OP) Place 1 drop into both eyes as needed (dry eyes).    [provider]  simvastatin (ZOCOR) 20 MG tablet Take 20 mg by mouth every  evening.  04/15/14   [provider]  tadalafil (CIALIS) 5 MG tablet Take 5 mg by mouth every other day.    [provider]  valsartan (DIOVAN) 160 MG tablet Take 1 tablet by mouth daily.    [provider]      Allergies    Sulfa antibiotics    Review of Systems   Review of Systems  Constitutional:  Negative for activity change, appetite change and fever.  HENT:  Negative for congestion and rhinorrhea.   Respiratory:  Negative for cough, chest tightness and shortness of breath.   Cardiovascular:  Negative for chest pain.  Gastrointestinal:  Positive for abdominal pain. Negative for nausea and vomiting.  Genitourinary:  Positive for decreased urine volume, difficulty urinating and urgency.  Musculoskeletal:  Positive for back pain.  Skin:  Negative for rash.  Neurological:  Negative for weakness and headaches.   all other systems are negative except as noted in the HPI and PMH.    Physical Exam Updated Vital Signs BP (!) 143/90 (BP Location: Left Arm)   Pulse 99   Temp 97.6 F (36.4 C) (Oral)   Resp 18   SpO2 96%  Physical Exam Vitals and nursing note reviewed.  Constitutional:      General: He is not in acute distress.    Appearance: He is well-developed.  HENT:     Head:  Normocephalic and atraumatic.     Mouth/Throat:     Pharynx: No oropharyngeal exudate.  Eyes:     Conjunctiva/sclera: Conjunctivae normal.     Pupils: Pupils are equal, round, and reactive to light.  Neck:     Comments: No meningismus. Cardiovascular:     Rate and Rhythm: Normal rate and regular rhythm.     Heart sounds: Normal heart sounds. No murmur heard. Pulmonary:     Effort: Pulmonary effort is normal. No respiratory distress.     Breath sounds: Normal breath sounds.  Abdominal:     General: There is distension.     Palpations: Abdomen is soft.     Tenderness: There is no abdominal tenderness. There is no guarding or rebound.     Comments: Palpable bladder   Musculoskeletal:        General: No tenderness. Normal range of motion.     Cervical back: Normal range of motion and neck supple.  Skin:    General: Skin is warm.  Neurological:     Mental Status: He is alert and oriented to person, place, and time.     Cranial Nerves: No cranial nerve deficit.     Motor: No abnormal muscle tone.     Coordination: Coordination normal.     Comments: No ataxia on finger to nose bilaterally. No pronator drift. 5/5 strength throughout. CN 2-12 intact.Equal grip strength. Sensation intact.   Psychiatric:        Behavior: Behavior normal.     ED Results / Procedures / Treatments   Labs (all labs ordered are listed, but only abnormal results are displayed) Labs Reviewed  CBC WITH DIFFERENTIAL/PLATELET - Abnormal; Notable for the following components:      Result Value   WBC 14.4 (*)    Neutro Abs 12.3 (*)    Abs Immature Granulocytes 0.24 (*)    All other components within normal limits  BASIC METABOLIC PANEL - Abnormal; Notable for the following components:   Sodium 133 (*)    CO2 21 (*)    Glucose, Bld 173 (*)    All other components within normal limits  URINALYSIS, ROUTINE W REFLEX MICROSCOPIC - Abnormal; Notable for the following components:   Color, Urine RED (*)    APPearance TURBID (*)    Glucose, UA   (*)    Value: TEST NOT REPORTED DUE TO COLOR INTERFERENCE OF URINE PIGMENT   Hgb urine dipstick   (*)    Value: TEST NOT REPORTED DUE TO COLOR INTERFERENCE OF URINE PIGMENT   Bilirubin Urine   (*)    Value: TEST NOT REPORTED DUE TO COLOR INTERFERENCE OF URINE PIGMENT   Ketones, ur   (*)    Value: TEST NOT REPORTED DUE TO COLOR INTERFERENCE OF URINE PIGMENT   Protein, ur   (*)    Value: TEST NOT REPORTED DUE TO COLOR INTERFERENCE OF URINE PIGMENT   Nitrite   (*)    Value: TEST NOT REPORTED DUE TO COLOR INTERFERENCE OF URINE PIGMENT   Leukocytes,Ua   (*)    Value: TEST NOT REPORTED DUE TO COLOR INTERFERENCE OF URINE PIGMENT   RBC / HPF  >50 (*)    Bacteria, UA RARE (*)    All other components within normal limits    EKG None  Radiology No results found.  Procedures Procedures    Medications Ordered in ED Medications - No data to display  ED Course/ Medical Decision Making/ A&P  Medical Decision Making Amount and/or Complexity of Data Reviewed Labs: ordered. Decision-making details documented in ED Course. Radiology: ordered and independent interpretation performed. Decision-making details documented in ED Course. ECG/medicine tests: ordered and independent interpretation performed. Decision-making details documented in ED Course.   Urinary retention after cystoscopy with bladder tumor removal today.  Vital stable, no distress.  Significant urinary retention on ultrasound greater than 800 mL.  Discussed with Dr. Pete Glatter of urology.  Despite recent of tumor near orifice of urethra, safe to place Foley catheter.  Foley catheter placed as above with drainage of grossly bloody urine.  This was irrigated and is starting to clear.  Labs with stable hemoglobin and stable creatinine.  Patient feels much improved after Foley catheter placement.  Abdominal pain has resolved.  Will leave Foley catheter in place and have follow-up with urology this week.  Urine is clearing becoming more pink-tinged.    He did not have any dizziness or lightheadedness.  No abdominal pain or back pain.  He appears stable to follow-up with urology.  Return precautions discussed.        Final Clinical Impression(s) / ED Diagnoses Final diagnoses:  Urinary retention    Rx / DC Orders ED Discharge Orders     None         Xzaria Teo, Jeannett Senior, MD 03/09/23 [phone]

## 2023-03-09 NOTE — ED Triage Notes (Signed)
Pt has voided minimally since surgery to remove bladder tumor yesterday. In a lot of pain. Reports dribbles

## 2023-03-09 NOTE — Discharge Instructions (Signed)
Call Dr. Laverle Patter today for follow-up appointment.  Keep catheter in place until he removes it in the office.  Return to the ED with worsening pain, unable to urinate, worsening blood in the urine, catheter not draining, fever, vomiting, or other concerns.

## 2023-03-12 LAB — SURGICAL PATHOLOGY

## 2023-03-13 DIAGNOSIS — R338 Other retention of urine: Secondary | ICD-10-CM | POA: Diagnosis not present

## 2023-03-20 DIAGNOSIS — N401 Enlarged prostate with lower urinary tract symptoms: Secondary | ICD-10-CM | POA: Diagnosis not present

## 2023-03-20 DIAGNOSIS — C678 Malignant neoplasm of overlapping sites of bladder: Secondary | ICD-10-CM | POA: Diagnosis not present

## 2023-03-20 DIAGNOSIS — R338 Other retention of urine: Secondary | ICD-10-CM | POA: Diagnosis not present

## 2023-03-28 ENCOUNTER — Ambulatory Visit (HOSPITAL_COMMUNITY): Payer: Medicare Other | Admitting: Certified Registered Nurse Anesthetist

## 2023-03-28 ENCOUNTER — Other Ambulatory Visit: Payer: Self-pay | Admitting: Urology

## 2023-03-28 ENCOUNTER — Encounter (HOSPITAL_COMMUNITY): Payer: Self-pay | Admitting: Urology

## 2023-03-28 ENCOUNTER — Encounter (HOSPITAL_COMMUNITY)
Admission: AD | Disposition: A | Discharge status: Home / Self Care | Payer: Self-pay | Source: Ambulatory Visit | Attending: Urology

## 2023-03-28 ENCOUNTER — Ambulatory Visit (HOSPITAL_COMMUNITY)
Admission: AD | Admit: 2023-03-28 | Discharge: 2023-03-28 | Disposition: A | Discharge status: Home / Self Care | Payer: Medicare Other | Source: Ambulatory Visit | Attending: Urology | Admitting: Urology

## 2023-03-28 ENCOUNTER — Ambulatory Visit (HOSPITAL_BASED_OUTPATIENT_CLINIC_OR_DEPARTMENT_OTHER): Payer: Medicare Other | Admitting: Certified Registered Nurse Anesthetist

## 2023-03-28 DIAGNOSIS — R319 Hematuria, unspecified: Secondary | ICD-10-CM

## 2023-03-28 DIAGNOSIS — F17200 Nicotine dependence, unspecified, uncomplicated: Secondary | ICD-10-CM | POA: Diagnosis not present

## 2023-03-28 DIAGNOSIS — Z09 Encounter for follow-up examination after completed treatment for conditions other than malignant neoplasm: Secondary | ICD-10-CM | POA: Diagnosis not present

## 2023-03-28 DIAGNOSIS — Z87891 Personal history of nicotine dependence: Secondary | ICD-10-CM | POA: Insufficient documentation

## 2023-03-28 DIAGNOSIS — Z79899 Other long term (current) drug therapy: Secondary | ICD-10-CM | POA: Insufficient documentation

## 2023-03-28 DIAGNOSIS — I10 Essential (primary) hypertension: Secondary | ICD-10-CM | POA: Insufficient documentation

## 2023-03-28 DIAGNOSIS — R310 Gross hematuria: Secondary | ICD-10-CM | POA: Insufficient documentation

## 2023-03-28 DIAGNOSIS — N3289 Other specified disorders of bladder: Secondary | ICD-10-CM | POA: Diagnosis not present

## 2023-03-28 DIAGNOSIS — M1990 Unspecified osteoarthritis, unspecified site: Secondary | ICD-10-CM | POA: Diagnosis not present

## 2023-03-28 DIAGNOSIS — K219 Gastro-esophageal reflux disease without esophagitis: Secondary | ICD-10-CM | POA: Diagnosis not present

## 2023-03-28 HISTORY — PX: TRANSURETHRAL RESECTION OF BLADDER TUMOR: SHX2575

## 2023-03-28 SURGERY — TURBT (TRANSURETHRAL RESECTION OF BLADDER TUMOR)
Anesthesia: General | Site: Bladder

## 2023-03-28 MED ORDER — PHENYLEPHRINE HCL (PRESSORS) 10 MG/ML IV SOLN
INTRAVENOUS | Status: AC
  Filled 2023-03-28: qty 1

## 2023-03-28 MED ORDER — DEXAMETHASONE SODIUM PHOSPHATE 10 MG/ML IJ SOLN
INTRAMUSCULAR | Status: DC | PRN
  Administered 2023-03-28: 4 mg via INTRAVENOUS

## 2023-03-28 MED ORDER — ACETAMINOPHEN 500 MG PO TABS: 1000.0000 mg | ORAL_TABLET | Freq: Once | ORAL | Status: DC

## 2023-03-28 MED ORDER — MIDAZOLAM HCL 2 MG/2ML IJ SOLN: 0.5000 mg | Freq: Once | INTRAMUSCULAR | Status: DC | PRN

## 2023-03-28 MED ORDER — ACETAMINOPHEN 10 MG/ML IV SOLN
INTRAVENOUS | Status: AC
  Filled 2023-03-28: qty 100

## 2023-03-28 MED ORDER — LIDOCAINE 2% (20 MG/ML) 5 ML SYRINGE
INTRAMUSCULAR | Status: DC | PRN
  Administered 2023-03-28: 60 mg via INTRAVENOUS

## 2023-03-28 MED ORDER — SUCCINYLCHOLINE CHLORIDE 200 MG/10ML IV SOSY
PREFILLED_SYRINGE | INTRAVENOUS | Status: AC
  Filled 2023-03-28: qty 10

## 2023-03-28 MED ORDER — SODIUM CHLORIDE 0.9 % IR SOLN
Status: DC | PRN
  Administered 2023-03-28: 1000 mL

## 2023-03-28 MED ORDER — CEFAZOLIN SODIUM-DEXTROSE 2-4 GM/100ML-% IV SOLN
2.0000 g | INTRAVENOUS | Status: AC
  Administered 2023-03-28: 2 g via INTRAVENOUS
  Filled 2023-03-28: qty 100

## 2023-03-28 MED ORDER — SODIUM CHLORIDE 0.9 % IR SOLN
Status: DC | PRN
  Administered 2023-03-28: 6000 mL

## 2023-03-28 MED ORDER — PROMETHAZINE HCL 25 MG/ML IJ SOLN: 6.2500 mg | INTRAMUSCULAR | Status: DC | PRN

## 2023-03-28 MED ORDER — ROCURONIUM BROMIDE 10 MG/ML (PF) SYRINGE
PREFILLED_SYRINGE | INTRAVENOUS | Status: AC
  Filled 2023-03-28: qty 10

## 2023-03-28 MED ORDER — PHENYLEPHRINE HCL-NACL 20-0.9 MG/250ML-% IV SOLN
INTRAVENOUS | Status: DC | PRN
  Administered 2023-03-28: 30 ug/min via INTRAVENOUS

## 2023-03-28 MED ORDER — ONDANSETRON HCL 4 MG/2ML IJ SOLN
INTRAMUSCULAR | Status: DC | PRN
  Administered 2023-03-28: 4 mg via INTRAVENOUS

## 2023-03-28 MED ORDER — ONDANSETRON HCL 4 MG/2ML IJ SOLN
INTRAMUSCULAR | Status: AC
  Filled 2023-03-28: qty 2

## 2023-03-28 MED ORDER — ACETAMINOPHEN 10 MG/ML IV SOLN
INTRAVENOUS | Status: DC | PRN
  Administered 2023-03-28: 1000 mg via INTRAVENOUS

## 2023-03-28 MED ORDER — ACETAMINOPHEN 500 MG PO TABS
ORAL_TABLET | ORAL | Status: AC
  Filled 2023-03-28: qty 2

## 2023-03-28 MED ORDER — LACTATED RINGERS IV SOLN: INTRAVENOUS | Status: DC

## 2023-03-28 MED ORDER — FENTANYL CITRATE (PF) 100 MCG/2ML IJ SOLN
INTRAMUSCULAR | Status: AC
  Filled 2023-03-28: qty 2

## 2023-03-28 MED ORDER — PROPOFOL 10 MG/ML IV BOLUS
INTRAVENOUS | Status: AC
  Filled 2023-03-28: qty 20

## 2023-03-28 MED ORDER — FINASTERIDE 5 MG PO TABS: 5.0000 mg | ORAL_TABLET | Freq: Every day | ORAL | 3 refills | Status: AC

## 2023-03-28 MED ORDER — MEPERIDINE HCL 50 MG/ML IJ SOLN: 6.2500 mg | INTRAMUSCULAR | Status: DC | PRN

## 2023-03-28 MED ORDER — OXYCODONE HCL 5 MG PO TABS: 5.0000 mg | ORAL_TABLET | Freq: Once | ORAL | Status: DC | PRN

## 2023-03-28 MED ORDER — CHLORHEXIDINE GLUCONATE 0.12 % MT SOLN
15.0000 mL | Freq: Once | OROMUCOSAL | Status: AC
  Administered 2023-03-28: 15 mL via OROMUCOSAL

## 2023-03-28 MED ORDER — OXYCODONE HCL 5 MG/5ML PO SOLN: 5.0000 mg | Freq: Once | ORAL | Status: DC | PRN

## 2023-03-28 MED ORDER — FENTANYL CITRATE PF 50 MCG/ML IJ SOSY: 25.0000 ug | PREFILLED_SYRINGE | INTRAMUSCULAR | Status: DC | PRN

## 2023-03-28 MED ORDER — GLYCOPYRROLATE 0.2 MG/ML IJ SOLN
INTRAMUSCULAR | Status: DC | PRN
  Administered 2023-03-28: .2 mg via INTRAVENOUS

## 2023-03-28 MED ORDER — FENTANYL CITRATE (PF) 100 MCG/2ML IJ SOLN
INTRAMUSCULAR | Status: DC | PRN
  Administered 2023-03-28 (×2): 25 ug via INTRAVENOUS

## 2023-03-28 MED ORDER — PROPOFOL 10 MG/ML IV BOLUS
INTRAVENOUS | Status: DC | PRN
  Administered 2023-03-28: 120 mg via INTRAVENOUS

## 2023-03-28 SURGICAL SUPPLY — 18 items
BAG DRN RND TRDRP ANRFLXCHMBR (UROLOGICAL SUPPLIES) ×1
BAG URINE DRAIN 2000ML AR STRL (UROLOGICAL SUPPLIES) IMPLANT
BAG URO CATCHER STRL LF (MISCELLANEOUS) ×1 IMPLANT
CATH FOLEY 2WAY SLVR 5CC 20FR (CATHETERS) IMPLANT
DRAPE FOOT SWITCH (DRAPES) ×1 IMPLANT
ELECT REM PT RETURN 15FT ADLT (MISCELLANEOUS) ×1 IMPLANT
GLOVE SURG LX STRL 7.5 STRW (GLOVE) ×1 IMPLANT
GOWN STRL REUS W/ TWL XL LVL3 (GOWN DISPOSABLE) ×1 IMPLANT
GOWN STRL REUS W/TWL XL LVL3 (GOWN DISPOSABLE) ×1
KIT TURNOVER KIT A (KITS) IMPLANT
LOOP CUT BIPOLAR 24F LRG (ELECTROSURGICAL) IMPLANT
MANIFOLD NEPTUNE II (INSTRUMENTS) ×1 IMPLANT
PACK CYSTO (CUSTOM PROCEDURE TRAY) ×1 IMPLANT
PENCIL SMOKE EVACUATOR (MISCELLANEOUS) IMPLANT
SYR TOOMEY IRRIG 70ML (MISCELLANEOUS) ×1
SYRINGE TOOMEY IRRIG 70ML (MISCELLANEOUS) IMPLANT
TUBING CONNECTING 10 (TUBING) ×1 IMPLANT
TUBING UROLOGY SET (TUBING) ×1 IMPLANT

## 2023-03-28 NOTE — Transfer of Care (Signed)
Immediate Anesthesia Transfer of Care Note  Patient: Todd Myers  Procedure(s) Performed: TRANSURETHRAL RESECTION OF BLADDER TUMOR (TURBT) (Bladder)  Patient Location: PACU  Anesthesia Type:General  Level of Consciousness: awake, alert , oriented, and patient cooperative  Airway & Oxygen Therapy: Patient Spontanous Breathing and Patient connected to face mask oxygen  Post-op Assessment: Report given to RN and Post -op Vital signs reviewed and stable  Post vital signs: Reviewed and stable  Last Vitals:  Vitals Value Taken Time  BP 118/74 03/28/23 2110  Temp    Pulse 73 03/28/23 2113  Resp 11 03/28/23 2113  SpO2 95 % 03/28/23 2113  Vitals shown include unfiled device data.  Last Pain:  Vitals:   03/28/23 1930  TempSrc: Oral  PainSc:          Complications: No notable events documented.

## 2023-03-28 NOTE — Discharge Instructions (Signed)
 You may see some blood in the urine and may have some burning with urination for 48-72 hours. You also may notice that you have to urinate more frequently or urgently after your procedure which is normal.  You should call should you develop an inability urinate, fever > 101, persistent nausea and vomiting that prevents you from eating or drinking to stay hydrated.  If you have a catheter, you will be taught how to take care of the catheter by the nursing staff prior to discharge from the hospital.  You may periodically feel a strong urge to void with the catheter in place.  This is a bladder spasm and most often can occur when having a bowel movement or moving around. It is typically self-limited and usually will stop after a few minutes.  You may use some Vaseline or Neosporin around the tip of the catheter to reduce friction at the tip of the penis. You may also see some blood in the urine.  A very small amount of blood can make the urine look quite red.  As long as the catheter is draining well, there usually is not a problem.  However, if the catheter is not draining well and is bloody, you should call the office ([phone]) to notify us.  

## 2023-03-28 NOTE — H&P (Signed)
Office Visit Report     03/28/2023   --------------------------------------------------------------------------------   Todd Myers  MRN: [id number]  DOB: 1940-12-04, 82 year old Male  SSN: [id number]   PRIMARY CARE:  Todd Leola Brazil, MD  PRIMARY CARE FAX:  [phone]  REFERRING:  Todd Myers  PROVIDER:  Heloise Myers, M.D.  LOCATION:  [facility] [phone]     --------------------------------------------------------------------------------   CC/HPI: Hematuria and clot retention   For full details, please see my last note. Todd Myers is an 82 year old gentleman who underwent a TURBT in late June for a high-grade, TA urothelial carcinoma. He was scheduled to begin BCG intravesical therapy next week. He developed recurrent gross hematuria over the weekend and subsequently developed clot retention. He presents today with a poor ability to void.     ALLERGIES: Sulfa Drugs    MEDICATIONS: Cialis 5 mg tablet TAKE 1 TABLET BY MOUTH DAILY AS DIRECTED  Tadalafil 5 mg tablet 1 tablet PO as directed  Tadalafil 5 mg tablet 1 tablet PO Daily  Tamsulosin Hcl 0.4 mg capsule 1 capsule PO Q HS  Irbesartan 150 mg tablet Oral  Simvastatin 20 mg tablet Oral  Vitamin D TABS Oral     GU PSH: Cystoscopy - 01/31/2023 Locm 300-399Mg /Ml Iodine,1Ml - 02/28/2023       PSH Notes: Appendectomy, Shoulder Surgery, Ear Surgery, Hernia Repair   NON-GU PSH: Appendectomy - 2014 Hernia Repair - 2014     GU PMH: Bladder Cancer overlapping sites - 03/20/2023 BPH Todd/LUTS - 03/20/2023, - 08/22/2022, - 08/24/2021, - 2022, Benign prostatic hyperplasia (BPH) with straining on urination, - 2016 Urinary Retention - 03/20/2023, - 03/13/2023 Bladder tumor/neoplasm - 02/28/2023, - 01/31/2023 Gross hematuria - 01/31/2023 ED due to arterial insufficiency - 08/22/2022, - 08/24/2021, Erectile dysfunction due to arterial insufficiency, - 2016 Elevated PSA - 08/22/2022, - 08/24/2021, - 2022,  Elevated prostate specific antigen (PSA), - 2016 Weak Urinary Stream - 08/22/2022, - 08/24/2021, - 2022, - 2017 Lower abdominal pain, unspecified, Inguinal pain - 2015 Renal calculus, Nephrolithiasis - 2014      PMH Notes:   1) Elevated PSA: His PSA has been as high as 5 and he has undergone a prior negative prostate biopsy by Todd Myers in [place] in 2012. He has no family history of prostate cancer.   Aug 2021: PSA 11.15 May 2020: MRI - No suspicious lesions, Vol 90 cc   2) Urolithiasis: He has a history of prior symptomatic urolithiasis.   3) BPH/LUTS: His symptoms are managed with alpha blocker therapy.   Current treatment: Cialis 5 mg 3 times weekly   4) Erectile dysfunction: He has been successfully treated with PDE-5 inhibitors.   Current treatment: Cialis 5 mg 3 times weekly     NON-GU PMH: Hypertension    FAMILY HISTORY: Acute Myocardial Infarction - Father Dementia - Mother Father Deceased At Age66 ___ - Mother Stroke Syndrome - Father   SOCIAL HISTORY: Marital Status: Married Preferred Language: English; Ethnicity: Not Hispanic Or Latino; Race: White Current Smoking Status: Patient does not smoke anymore.   Tobacco Use Assessment Completed: Used Tobacco in last 30 days? Does drink.  Drinks 1 caffeinated drink per day.     Notes: Former smoker, Marital History - Currently Married, Occupation: Retired, Alcohol Use   REVIEW OF SYSTEMS:    GU Review Male:   Patient reports burning/ pain with urination. Patient denies frequent urination, hard to postpone urination, get up at night  to urinate, leakage of urine, stream starts and stops, trouble starting your streams, and have to strain to urinate .  Gastrointestinal (Lower):   Patient denies diarrhea and constipation.  Gastrointestinal (Upper):   Patient denies nausea and vomiting.  Constitutional:   Patient denies fever, night sweats, weight loss, and fatigue.  Skin:   Patient denies itching and skin rash/  lesion.  Eyes:   Patient denies blurred vision and double vision.  Ears/ Nose/ Throat:   Patient denies sore throat and sinus problems.  Hematologic/Lymphatic:   Patient denies swollen glands and easy bruising.  Cardiovascular:   Patient denies leg swelling and chest pains.  Respiratory:   Patient denies cough and shortness of breath.  Endocrine:   Patient denies excessive thirst.  Musculoskeletal:   Patient denies back pain and joint pain.  Neurological:   Patient denies headaches and dizziness.  Psychologic:   Patient denies depression and anxiety.   Notes: Pain in lower back, started on Saturday, pt stated he has a lot of tissue in his urine along with clots, hematuira    VITAL SIGNS:      03/28/2023 11:18 AM  BP 129/79 mmHg  Pulse 90 /min  Temperature 97.7 F / 36.5 C   GU PHYSICAL EXAMINATION:    Urethral Meatus: Normal size. No lesion, no wart, no discharge, no polyp. Normal location.   MULTI-SYSTEM PHYSICAL EXAMINATION:    Constitutional: Well-nourished. No physical deformities. Normally developed. Good grooming.  Respiratory: No labored breathing, no use of accessory muscles.   Cardiovascular: Normal temperature, normal extremity pulses, no swelling, no varicosities.     Complexity of Data:  Records Review:   Pathology Reports, Previous Patient Records   08/15/22 08/17/21 12/07/20 07/12/20 04/21/20 10/14/19 09/10/18 08/16/18  PSA  Total PSA 7.68 ng/mL 6.36 ng/mL 7.97 ng/mL 9.45 ng/mL 11.40 ng/mL 6.032 ng/dl 7.82 ng/mL 9.56 ng/dl  Free PSA 2.13 ng/mL 0.86 ng/mL  1.27 ng/mL      % Free PSA 15 % PSA 17 % PSA  13 % PSA        PROCEDURES:         PVR Ultrasound - 57846  Scanned Volume: 462 cc         Morphine 4mg  - [id number], 96372 zero wasted   Qty: 4 Adm. By: Todd Myers  Unit: mg Lot No [id number]  Route: IM Exp. Date 04/11/2024  Freq: None Mfgr.:   Site: Right Buttock        Notes:   A 22 French hematuria catheter was placed. I then hand irrigated this and a few  clots were removed but he continued to develop hematuria that was not resolving indicating probable active bleeding.   ASSESSMENT:      ICD-10 Details  1 GU:   Gross hematuria - R31.0    PLAN:           Document Letter(s):  Created for Patient: Clinical Summary   Created for Patient: Clinical Summary         Notes:   1. Hematuria/clot retention: Considering the inability to remove his clots from his bladder, we have reviewed options and I did recommend proceeding to the operating room later today for cystoscopy with clot evacuation and fulguration of any active bleeding sites. Most likely, he will be able to be discharged from the operating room this evening and hopefully without a catheter. This will help to avoid delaying his intravesical BCG therapy next week. We have reviewed the potential risks, complications, and  expected recovery process associated with this procedure. He gives informed consent.   CC: Dr. Eric Form        Next Appointment:      Next Appointment: 04/03/2023 03:30 PM    Appointment Type: BCG Treatment    Location: [facility] [phone]    Provider: NVALL NVALL    Reason for Visit: 1 of 6 BCG

## 2023-03-28 NOTE — Op Note (Signed)
Preoperative diagnosis: Hematuria and clot retention  Postoperative diagnosis: Hematuria and clot retention  Procedures: 1.  Cystoscopy 2.  Clot evacuation 3.  Fulguration of bladder and prostate  Surgeon: Moody Bruins MD  Anesthesia: General  Complications: None  EBL: Minimal  Specimens: None  Indication: Todd Myers is an 82 year old gentleman who underwent a transurethral resection of a bladder tumor approximately 1 month ago.  He recently developed recurrent bleeding a few days ago and subsequent clot retention.  He presented to the office today and a catheter was placed and attempts were made to irrigate his bladder which were unsuccessful.  He presents today for the above procedures.  The potential risks, complications, and expected recovery process was discussed in detail.  Informed consent was obtained.  Description of procedure: The patient was taken to the operating room and a general anesthetic was administered.  He was given preoperative antibiotics, placed in the dorsolithotomy position, and prepped and draped in the usual sterile fashion.  Next, a preoperative timeout was performed.  Cystourethroscopy was then performed utilizing the resectoscope and the visual obturator.  This revealed a large amount of clot within the bladder that was evacuated using a To[place]y syringe.  Once all clot was removed, inspection of the bladder tumor site did not reveal active bleeding.  There was bleeding at the bladder neck and in the prostatic urethra which was fulgurated using a bipolar cautery.  The bleeding appeared to be more related to his BPH as opposed to the bladder tumor site.  It was felt that he would benefit from a 20 [place] Foley catheter which was placed at the end of the procedure and placed to straight drainage.  He tolerated the procedure well without complications.  He was able to be awakened and transferred to recovery in satisfactory condition.

## 2023-03-28 NOTE — Anesthesia Procedure Notes (Signed)
Procedure Name: LMA Insertion Date/Time: 03/28/2023 8:33 PM  Performed by: Ponciano Ort, CRNAPre-anesthesia Checklist: Patient identified, Emergency Drugs available, Suction available and Patient being monitored Patient Re-evaluated:Patient Re-evaluated prior to induction Oxygen Delivery Method: Circle system utilized Preoxygenation: Pre-oxygenation with 100% oxygen Induction Type: IV induction LMA: LMA inserted LMA Size: 4.0 Number of attempts: 1 Placement Confirmation: positive ETCO2 and breath sounds checked- equal and bilateral Tube secured with: Tape Dental Injury: Teeth and Oropharynx as per pre-operative assessment

## 2023-03-28 NOTE — Anesthesia Postprocedure Evaluation (Signed)
Anesthesia Post Note  Patient: Todd Myers  Procedure(s) Performed: TRANSURETHRAL RESECTION OF BLADDER TUMOR (TURBT) (Bladder)     Patient location during evaluation: PACU Anesthesia Type: General Level of consciousness: awake and alert, patient cooperative and oriented Pain management: pain level controlled Vital Signs Assessment: post-procedure vital signs reviewed and stable Respiratory status: spontaneous breathing, nonlabored ventilation and respiratory function stable Cardiovascular status: blood pressure returned to baseline and stable Postop Assessment: no apparent nausea or vomiting Anesthetic complications: no   No notable events documented.  Last Vitals:  Vitals:   03/28/23 2115 03/28/23 2130  BP: 121/76 126/72  Pulse: 73 73  Resp: 12 11  Temp:    SpO2: 96% 95%    Last Pain:  Vitals:   03/28/23 2109  TempSrc:   PainSc: 0-No pain                 Herald Vallin,E. Emma-Lee Oddo

## 2023-03-28 NOTE — Anesthesia Preprocedure Evaluation (Addendum)
Anesthesia Evaluation  Patient identified by MRN, date of birth, ID band Patient awake    Reviewed: Allergy & Precautions, NPO status , Patient's Chart, lab work & pertinent test results  History of Anesthesia Complications Negative for: history of anesthetic complications  Airway Mallampati: I  TM Distance: >3 FB Neck ROM: Full    Dental  (+) Chipped, Dental Advisory Given   Pulmonary former smoker   breath sounds clear to auscultation       Cardiovascular hypertension, Pt. on medications (-) angina  Rhythm:Regular Rate:Normal     Neuro/Psych negative neurological ROS     GI/Hepatic Neg liver ROS,GERD  Controlled,,  Endo/Other  negative endocrine ROS    Renal/GU negative Renal ROS   Bladder cancer    Musculoskeletal  (+) Arthritis ,    Abdominal   Peds  Hematology negative hematology ROS (+)   Anesthesia Other Findings   Reproductive/Obstetrics                              Anesthesia Physical Anesthesia Plan  ASA: 3  Anesthesia Plan: General   Post-op Pain Management: Tylenol PO (pre-op)*   Induction: Intravenous  PONV Risk Score and Plan: 2 and Ondansetron and Dexamethasone  Airway Management Planned: LMA  Additional Equipment: None  Intra-op Plan:   Post-operative Plan:   Informed Consent: I have reviewed the patients History and Physical, chart, labs and discussed the procedure including the risks, benefits and alternatives for the proposed anesthesia with the patient or authorized representative who has indicated his/her understanding and acceptance.     Dental advisory given  Plan Discussed with: CRNA and Surgeon  Anesthesia Plan Comments:         Anesthesia Quick Evaluation

## 2023-03-29 ENCOUNTER — Encounter (HOSPITAL_COMMUNITY): Payer: Self-pay | Admitting: Urology

## 2023-03-30 DIAGNOSIS — R310 Gross hematuria: Secondary | ICD-10-CM | POA: Diagnosis not present

## 2023-04-03 DIAGNOSIS — C678 Malignant neoplasm of overlapping sites of bladder: Secondary | ICD-10-CM | POA: Diagnosis not present

## 2023-04-03 DIAGNOSIS — Z5111 Encounter for antineoplastic chemotherapy: Secondary | ICD-10-CM | POA: Diagnosis not present

## 2023-04-10 DIAGNOSIS — C678 Malignant neoplasm of overlapping sites of bladder: Secondary | ICD-10-CM | POA: Diagnosis not present

## 2023-04-10 DIAGNOSIS — Z5111 Encounter for antineoplastic chemotherapy: Secondary | ICD-10-CM | POA: Diagnosis not present

## 2023-04-10 DIAGNOSIS — R8271 Bacteriuria: Secondary | ICD-10-CM | POA: Diagnosis not present

## 2023-04-17 DIAGNOSIS — C678 Malignant neoplasm of overlapping sites of bladder: Secondary | ICD-10-CM | POA: Diagnosis not present

## 2023-04-17 DIAGNOSIS — R8271 Bacteriuria: Secondary | ICD-10-CM | POA: Diagnosis not present

## 2023-04-24 DIAGNOSIS — Z5111 Encounter for antineoplastic chemotherapy: Secondary | ICD-10-CM | POA: Diagnosis not present

## 2023-04-24 DIAGNOSIS — C678 Malignant neoplasm of overlapping sites of bladder: Secondary | ICD-10-CM | POA: Diagnosis not present

## 2023-05-01 DIAGNOSIS — C678 Malignant neoplasm of overlapping sites of bladder: Secondary | ICD-10-CM | POA: Diagnosis not present

## 2023-05-01 DIAGNOSIS — Z5111 Encounter for antineoplastic chemotherapy: Secondary | ICD-10-CM | POA: Diagnosis not present

## 2023-05-08 DIAGNOSIS — Z5111 Encounter for antineoplastic chemotherapy: Secondary | ICD-10-CM | POA: Diagnosis not present

## 2023-05-08 DIAGNOSIS — C678 Malignant neoplasm of overlapping sites of bladder: Secondary | ICD-10-CM | POA: Diagnosis not present

## 2023-05-08 DIAGNOSIS — R8271 Bacteriuria: Secondary | ICD-10-CM | POA: Diagnosis not present

## 2023-05-15 DIAGNOSIS — C678 Malignant neoplasm of overlapping sites of bladder: Secondary | ICD-10-CM | POA: Diagnosis not present

## 2023-05-15 DIAGNOSIS — Z5111 Encounter for antineoplastic chemotherapy: Secondary | ICD-10-CM | POA: Diagnosis not present

## 2023-06-04 DIAGNOSIS — Z23 Encounter for immunization: Secondary | ICD-10-CM | POA: Diagnosis not present

## 2023-06-05 DIAGNOSIS — L821 Other seborrheic keratosis: Secondary | ICD-10-CM | POA: Diagnosis not present

## 2023-06-05 DIAGNOSIS — D1801 Hemangioma of skin and subcutaneous tissue: Secondary | ICD-10-CM | POA: Diagnosis not present

## 2023-06-05 DIAGNOSIS — D225 Melanocytic nevi of trunk: Secondary | ICD-10-CM | POA: Diagnosis not present

## 2023-06-05 DIAGNOSIS — L570 Actinic keratosis: Secondary | ICD-10-CM | POA: Diagnosis not present

## 2023-06-05 DIAGNOSIS — D485 Neoplasm of uncertain behavior of skin: Secondary | ICD-10-CM | POA: Diagnosis not present

## 2023-06-27 DIAGNOSIS — Z8551 Personal history of malignant neoplasm of bladder: Secondary | ICD-10-CM | POA: Diagnosis not present

## 2023-06-27 DIAGNOSIS — R8289 Other abnormal findings on cytological and histological examination of urine: Secondary | ICD-10-CM | POA: Diagnosis not present

## 2023-07-20 DIAGNOSIS — H52203 Unspecified astigmatism, bilateral: Secondary | ICD-10-CM | POA: Diagnosis not present

## 2023-07-20 DIAGNOSIS — H21233 Degeneration of iris (pigmentary), bilateral: Secondary | ICD-10-CM | POA: Diagnosis not present

## 2023-07-20 DIAGNOSIS — H353121 Nonexudative age-related macular degeneration, left eye, early dry stage: Secondary | ICD-10-CM | POA: Diagnosis not present

## 2023-07-20 DIAGNOSIS — Z961 Presence of intraocular lens: Secondary | ICD-10-CM | POA: Diagnosis not present

## 2023-07-25 DIAGNOSIS — C678 Malignant neoplasm of overlapping sites of bladder: Secondary | ICD-10-CM | POA: Diagnosis not present

## 2023-07-25 DIAGNOSIS — Z5111 Encounter for antineoplastic chemotherapy: Secondary | ICD-10-CM | POA: Diagnosis not present

## 2023-08-01 DIAGNOSIS — Z5111 Encounter for antineoplastic chemotherapy: Secondary | ICD-10-CM | POA: Diagnosis not present

## 2023-08-01 DIAGNOSIS — C678 Malignant neoplasm of overlapping sites of bladder: Secondary | ICD-10-CM | POA: Diagnosis not present

## 2023-08-08 DIAGNOSIS — C678 Malignant neoplasm of overlapping sites of bladder: Secondary | ICD-10-CM | POA: Diagnosis not present

## 2023-08-08 DIAGNOSIS — Z5111 Encounter for antineoplastic chemotherapy: Secondary | ICD-10-CM | POA: Diagnosis not present

## 2023-08-15 DIAGNOSIS — M25512 Pain in left shoulder: Secondary | ICD-10-CM | POA: Diagnosis not present

## 2023-09-17 DIAGNOSIS — J209 Acute bronchitis, unspecified: Secondary | ICD-10-CM | POA: Diagnosis not present

## 2023-09-17 DIAGNOSIS — R051 Acute cough: Secondary | ICD-10-CM | POA: Diagnosis not present

## 2023-10-03 DIAGNOSIS — R972 Elevated prostate specific antigen [PSA]: Secondary | ICD-10-CM | POA: Diagnosis not present

## 2023-10-10 DIAGNOSIS — R3912 Poor urinary stream: Secondary | ICD-10-CM | POA: Diagnosis not present

## 2023-10-10 DIAGNOSIS — N401 Enlarged prostate with lower urinary tract symptoms: Secondary | ICD-10-CM | POA: Diagnosis not present

## 2023-10-10 DIAGNOSIS — N5201 Erectile dysfunction due to arterial insufficiency: Secondary | ICD-10-CM | POA: Diagnosis not present

## 2023-10-10 DIAGNOSIS — R972 Elevated prostate specific antigen [PSA]: Secondary | ICD-10-CM | POA: Diagnosis not present

## 2023-10-10 DIAGNOSIS — Z8551 Personal history of malignant neoplasm of bladder: Secondary | ICD-10-CM | POA: Diagnosis not present

## 2023-10-10 DIAGNOSIS — R8289 Other abnormal findings on cytological and histological examination of urine: Secondary | ICD-10-CM | POA: Diagnosis not present

## 2023-10-24 DIAGNOSIS — Z23 Encounter for immunization: Secondary | ICD-10-CM | POA: Diagnosis not present

## 2023-10-25 ENCOUNTER — Encounter (HOSPITAL_BASED_OUTPATIENT_CLINIC_OR_DEPARTMENT_OTHER): Payer: Self-pay | Admitting: Emergency Medicine

## 2023-10-25 ENCOUNTER — Other Ambulatory Visit: Payer: Self-pay

## 2023-10-25 ENCOUNTER — Emergency Department (HOSPITAL_BASED_OUTPATIENT_CLINIC_OR_DEPARTMENT_OTHER)
Admission: EM | Admit: 2023-10-25 | Discharge: 2023-10-25 | Disposition: A | Discharge status: Home / Self Care | Payer: Medicare Other

## 2023-10-25 DIAGNOSIS — H9222 Otorrhagia, left ear: Secondary | ICD-10-CM | POA: Insufficient documentation

## 2023-10-25 DIAGNOSIS — H9202 Otalgia, left ear: Secondary | ICD-10-CM

## 2023-10-25 NOTE — ED Triage Notes (Signed)
Pt reports concern for perf eardrum after being cleaned yesterday by audiologist.. Reports LT ear bleeding.  Tylenol pta

## 2023-10-25 NOTE — Discharge Instructions (Signed)
Please avoid getting your ear wet.  Continue to use cotton balls.  Please follow-up with ear nose throat as needed for further wax removal and reevaluation.

## 2023-10-25 NOTE — ED Notes (Signed)

## 2023-10-25 NOTE — ED Provider Notes (Signed)
[name] EMERGENCY DEPARTMENT AT [facility] Provider Note   CSN: [id number] Arrival date & time: 10/25/23  1024     History  Chief Complaint  Patient presents with   Ear Pain    Todd Myers is a 83 y.o. male.  Seen yesterday by audiologist and had his ears cleaned.  Has had bleeding since then.  Wears hearing aids.  Reports some muffled hearing        Home Medications Prior to Admission medications   Medication Sig Start Date End Date Taking? Authorizing Provider  acetaminophen (TYLENOL) 500 MG tablet Take 500 mg by mouth every 6 (six) hours as needed for moderate pain.    [provider]  calcium carbonate (TUMS - DOSED IN MG ELEMENTAL CALCIUM) 500 MG chewable tablet Chew 1 tablet by mouth daily.    [provider]  cholecalciferol (VITAMIN D3) 25 MCG (1000 UNIT) tablet Take 2,000 Units by mouth daily.    [provider]  cyclobenzaprine (FLEXERIL) 10 MG tablet Take 10 mg by mouth 3 (three) times daily as needed for muscle spasms.    [provider]  famotidine (PEPCID) 20 MG tablet Take 20 mg by mouth every other day. Takes with Cialis    [provider]  finasteride (PROSCAR) 5 MG tablet Take 1 tablet (5 mg total) by mouth daily. 03/28/23 03/27/24  Heloise Purpura, MD  phenazopyridine (PYRIDIUM) 200 MG tablet Take 1 tablet (200 mg total) by mouth 3 (three) times daily as needed for pain. 03/08/23   Heloise Purpura, MD  Polyethyl Glycol-Propyl Glycol (SYSTANE OP) Place 1 drop into both eyes as needed (dry eyes).    [provider]  simvastatin (ZOCOR) 20 MG tablet Take 20 mg by mouth every evening.  04/15/14   [provider]  tadalafil (CIALIS) 5 MG tablet Take 5 mg by mouth every other day.    [provider]  valsartan (DIOVAN) 160 MG tablet Take 1 tablet by mouth daily.    [provider]      Allergies    Sulfa antibiotics    Review of Systems   Review of Systems  Physical  Exam Updated Vital Signs BP 118/77 (BP Location: Left Arm)   Pulse 65   Temp 97.8 F (36.6 C)   Resp 16   SpO2 96%  Physical Exam Vitals and nursing note reviewed.  HENT:     Head: Normocephalic.     Ears:     Comments: Left ear canal with some excoriations and scant blood in the ear canal.  There was some wax the bottom 20% of the eardrum securing view, but eardrum visualized was intact. Eyes:     Conjunctiva/sclera: Conjunctivae normal.  Cardiovascular:     Rate and Rhythm: Normal rate.  Pulmonary:     Effort: Pulmonary effort is normal.  Neurological:     Mental Status: He is alert.     ED Results / Procedures / Treatments   Labs (all labs ordered are listed, but only abnormal results are displayed) Labs Reviewed - No data to display  EKG None  Radiology No results found.  Procedures Procedures    Medications Ordered in ED Medications - No data to display  ED Course/ Medical Decision Making/ A&P                                 Medical Decision Making 83 year old male with left ear  bleeding after having ears cleaned by audiologist.  Afebrile vital signs reassuring.  Has excoriations and scant bleeding in ear canal.  Does not appear to have obvious TM perforation.  Stable for discharge.  Audiologist was not able to remove wax near eardrum.  Will give ENT follow-up.         Final Clinical Impression(s) / ED Diagnoses Final diagnoses:  None    Rx / DC Orders ED Discharge Orders     None         Coral Spikes, DO 10/25/23 1201

## 2023-10-26 ENCOUNTER — Telehealth (INDEPENDENT_AMBULATORY_CARE_PROVIDER_SITE_OTHER): Payer: Self-pay | Admitting: Physician Assistant

## 2023-10-26 DIAGNOSIS — H9222 Otorrhagia, left ear: Secondary | ICD-10-CM | POA: Diagnosis not present

## 2023-10-26 DIAGNOSIS — H6120 Impacted cerumen, unspecified ear: Secondary | ICD-10-CM | POA: Diagnosis not present

## 2023-10-26 DIAGNOSIS — H9193 Unspecified hearing loss, bilateral: Secondary | ICD-10-CM | POA: Diagnosis not present

## 2023-10-26 NOTE — Telephone Encounter (Signed)
Called patient to schedule an ed follow up with Burna Forts on 10/30/2023.Unable to leave vm

## 2023-10-29 ENCOUNTER — Telehealth (INDEPENDENT_AMBULATORY_CARE_PROVIDER_SITE_OTHER): Payer: Self-pay | Admitting: Physician Assistant

## 2023-10-29 DIAGNOSIS — C678 Malignant neoplasm of overlapping sites of bladder: Secondary | ICD-10-CM | POA: Diagnosis not present

## 2023-10-29 NOTE — Telephone Encounter (Signed)
Left vm to confirm appt and address for 10/30/2023.

## 2023-10-30 ENCOUNTER — Ambulatory Visit (INDEPENDENT_AMBULATORY_CARE_PROVIDER_SITE_OTHER): Payer: Medicare Other

## 2023-10-30 VITALS — BP 123/80 | HR 72

## 2023-10-30 DIAGNOSIS — H6122 Impacted cerumen, left ear: Secondary | ICD-10-CM | POA: Diagnosis not present

## 2023-10-30 NOTE — Progress Notes (Unsigned)
Dear Dr. Clelia Croft, Here is my assessment for our mutual patient, Todd Myers. Thank you for allowing me the opportunity to care for your patient. Please do not hesitate to contact me should you have any other questions. Sincerely, Burna Forts PA-C  Otolaryngology Clinic Note Referring provider: Dr. Clelia Croft HPI:  Todd Myers is a 83 y.o. male kindly referred by Dr. Clelia Croft   The patient is a 83 year old gentleman seen in our office for evaluation of cerumen impaction and ear irritation.  He notes that he was seen by an audiologist several days ago as he has been wearing hearing aids for 25 years.  He notes he had he is ears cleaned out, this was uncomfortable, they noted some bleeding at the time.  He notes this continue to persist, left was greater than right.  He followed up in the emergency room on 10/25/2023.  At that time he had some excoriations and scant blood in the ear canal as well as some remaining cerumen.  He was not started on any drops, he notes since that visit he has had improvement in his discomfort.  Today denies any discomfort, swelling, pain, discharge, changes in hearing.  His surgical history is significant for right tympanoplasty in the 1970s.    H&N Surgery: Right-sided tympanoplasty 1970s  Independent Review of Additional Tests or Records:  none   PMH/Meds/All/SocHx/FamHx/ROS:   Past Medical History:  Diagnosis Date   BPH associated with nocturia    w/ hx elevated psa , negative biopsy's   Cancer (HCC)    Dyslipidemia    GERD (gastroesophageal reflux disease)    HTN (hypertension)    OA (osteoarthritis)    right shoulder and left shoulder   Wears glasses    Wears hearing aid in both ears      Past Surgical History:  Procedure Laterality Date   APPENDECTOMY  1980s   CATARACT EXTRACTION W/ INTRAOCULAR LENS  IMPLANT, BILATERAL  left 2017;  right 2016   CYSTOSCOPY  03/08/2023   Procedure: CYSTOSCOPY;  Surgeon: Heloise Purpura, MD;  Location: [facility];  Service:  Urology;;   INGUINAL HERNIA REPAIR  x2  right  and x2  left in the 1980s   REVERSE SHOULDER ARTHROPLASTY Right 10/31/2018   Procedure: RIGHT REVERSE SHOULDER ARTHROPLASTY;  Surgeon: Francena Hanly, MD;  Location: [facility];  Service: Orthopedics;  Laterality: Right;  120 minutes   ROTATOR CUFF REPAIR Right 2009   TRANSURETHRAL RESECTION OF BLADDER TUMOR N/A 03/08/2023   Procedure: TRANSURETHRAL RESECTION OF BLADDER TUMOR (TURBT);  Surgeon: Heloise Purpura, MD;  Location: [facility];  Service: Urology;  Laterality: N/A;  60 MINUTES NEEDED FOR CASE   TRANSURETHRAL RESECTION OF BLADDER TUMOR N/A 03/28/2023   Procedure: TRANSURETHRAL RESECTION OF BLADDER TUMOR (TURBT);  Surgeon: Heloise Purpura, MD;  Location: [facility];  Service: Urology;  Laterality: N/A;   TYMPANOPLASTY Right 1976    Family History  Problem Relation Age of Onset   COPD Mother    Heart disease Father      Social Connections: Not on file      Current Outpatient Medications:    acetaminophen (TYLENOL) 500 MG tablet, Take 500 mg by mouth every 6 (six) hours as needed for moderate pain., Disp: , Rfl:    calcium carbonate (TUMS - DOSED IN MG ELEMENTAL CALCIUM) 500 MG chewable tablet, Chew 1 tablet by mouth daily., Disp: , Rfl:    cholecalciferol (VITAMIN D3) 25 MCG (1000 UNIT) tablet, Take 2,000 Units by mouth daily., Disp: ,  Rfl:    cyclobenzaprine (FLEXERIL) 10 MG tablet, Take 10 mg by mouth 3 (three) times daily as needed for muscle spasms., Disp: , Rfl:    famotidine (PEPCID) 20 MG tablet, Take 20 mg by mouth every other day. Takes with Cialis, Disp: , Rfl:    finasteride (PROSCAR) 5 MG tablet, Take 1 tablet (5 mg total) by mouth daily., Disp: 90 tablet, Rfl: 3   phenazopyridine (PYRIDIUM) 200 MG tablet, Take 1 tablet (200 mg total) by mouth 3 (three) times daily as needed for pain., Disp: 20 tablet, Rfl: 0   Polyethyl Glycol-Propyl Glycol (SYSTANE OP), Place 1 drop into both eyes as needed (dry eyes)., Disp: , Rfl:    simvastatin  (ZOCOR) 20 MG tablet, Take 20 mg by mouth every evening. , Disp: , Rfl:    tadalafil (CIALIS) 5 MG tablet, Take 5 mg by mouth every other day., Disp: , Rfl:    valsartan (DIOVAN) 160 MG tablet, Take 1 tablet by mouth daily., Disp: , Rfl:    Physical Exam:   BP 123/80 (BP Location: Left Arm, Patient Position: Sitting, Cuff Size: Normal)   Pulse 72   SpO2 94%   Pertinent Findings  Left EAC with cerumen impaction, minimal excoriation to EAC, no redness, bleeding, or drainage, TM intact, right EAC clear, TM intact with scarring over TM Weber 512: equal Rinne 512: AC > BC b/l  Anterior rhinoscopy: Septum midline No lesions of oral cavity/oropharynx; dentition wnl No obviously palpable neck masses/lymphadenopathy/thyromegaly No respiratory distress or stridor  Seprately Identifiable Procedures:  Procedure: Bilateral ear microscopy and cerumen removal using microscope (CPT 56213) - Mod 25 Pre-procedure diagnosis: left Cerumen impaction left external ears Post-procedure diagnosis: same Indication:  cerumen impaction; given patient's otologic complaints and history as well as for improved and comprehensive examination of external ear and tympanic membrane, bilateral otologic examination using microscope was performed and impacted cerumen removed  Procedure: Patient was placed semi-recumbent. Both ear canals were examined using the microscope with findings above. Cerumen removed on left and on right using suction and currette with improvement in EAC examination and patency. Left: EAC was patent. TM was intact . Middle ear was aerated. Drainage: none Right: EAC was patent. TM was intact . Middle ear was aerated . Drainage: none Patient tolerated the procedure well.      Impression & Plans:  Todd Myers is a 83 y.o. male with the following   Cerumen impaction-  The patient has cerumen impaction on the left, this was cleared without issue.  He has some minimal excoriation no redness, no  signs of infection.  Hearing is at baseline.  He may continue outpatient follow-up with audiology, return as needed.  He verbalized understanding and agreement to today's plan had no further questions or concerns.     - f/u PRN   Thank you for allowing me the opportunity to care for your patient. Please do not hesitate to contact me should you have any other questions.  Sincerely, Burna Forts PA-C [facility] Phone: [phone] Fax: [phone]  11/01/2023, 11:02 AM

## 2023-11-01 ENCOUNTER — Encounter (INDEPENDENT_AMBULATORY_CARE_PROVIDER_SITE_OTHER): Payer: Self-pay | Admitting: Physician Assistant

## 2023-11-05 DIAGNOSIS — R8271 Bacteriuria: Secondary | ICD-10-CM | POA: Diagnosis not present

## 2023-11-05 DIAGNOSIS — C678 Malignant neoplasm of overlapping sites of bladder: Secondary | ICD-10-CM | POA: Diagnosis not present

## 2023-11-12 DIAGNOSIS — C678 Malignant neoplasm of overlapping sites of bladder: Secondary | ICD-10-CM | POA: Diagnosis not present

## 2023-12-04 DIAGNOSIS — N401 Enlarged prostate with lower urinary tract symptoms: Secondary | ICD-10-CM | POA: Diagnosis not present

## 2023-12-04 DIAGNOSIS — M8580 Other specified disorders of bone density and structure, unspecified site: Secondary | ICD-10-CM | POA: Diagnosis not present

## 2023-12-04 DIAGNOSIS — I10 Essential (primary) hypertension: Secondary | ICD-10-CM | POA: Diagnosis not present

## 2023-12-04 DIAGNOSIS — E785 Hyperlipidemia, unspecified: Secondary | ICD-10-CM | POA: Diagnosis not present

## 2023-12-04 DIAGNOSIS — R972 Elevated prostate specific antigen [PSA]: Secondary | ICD-10-CM | POA: Diagnosis not present

## 2023-12-11 DIAGNOSIS — R82998 Other abnormal findings in urine: Secondary | ICD-10-CM | POA: Diagnosis not present

## 2023-12-11 DIAGNOSIS — M25512 Pain in left shoulder: Secondary | ICD-10-CM | POA: Diagnosis not present

## 2023-12-11 DIAGNOSIS — Z1339 Encounter for screening examination for other mental health and behavioral disorders: Secondary | ICD-10-CM | POA: Diagnosis not present

## 2023-12-11 DIAGNOSIS — I10 Essential (primary) hypertension: Secondary | ICD-10-CM | POA: Diagnosis not present

## 2023-12-11 DIAGNOSIS — R972 Elevated prostate specific antigen [PSA]: Secondary | ICD-10-CM | POA: Diagnosis not present

## 2023-12-11 DIAGNOSIS — Z0000 Encounter for general adult medical examination without abnormal findings: Secondary | ICD-10-CM | POA: Diagnosis not present

## 2023-12-11 DIAGNOSIS — H9319 Tinnitus, unspecified ear: Secondary | ICD-10-CM | POA: Diagnosis not present

## 2023-12-11 DIAGNOSIS — Z1331 Encounter for screening for depression: Secondary | ICD-10-CM | POA: Diagnosis not present

## 2023-12-11 DIAGNOSIS — C679 Malignant neoplasm of bladder, unspecified: Secondary | ICD-10-CM | POA: Diagnosis not present

## 2023-12-11 DIAGNOSIS — K769 Liver disease, unspecified: Secondary | ICD-10-CM | POA: Diagnosis not present

## 2023-12-11 DIAGNOSIS — N401 Enlarged prostate with lower urinary tract symptoms: Secondary | ICD-10-CM | POA: Diagnosis not present

## 2023-12-11 DIAGNOSIS — E785 Hyperlipidemia, unspecified: Secondary | ICD-10-CM | POA: Diagnosis not present

## 2023-12-11 DIAGNOSIS — M8580 Other specified disorders of bone density and structure, unspecified site: Secondary | ICD-10-CM | POA: Diagnosis not present

## 2023-12-12 DIAGNOSIS — R8289 Other abnormal findings on cytological and histological examination of urine: Secondary | ICD-10-CM | POA: Diagnosis not present

## 2023-12-12 DIAGNOSIS — Z8551 Personal history of malignant neoplasm of bladder: Secondary | ICD-10-CM | POA: Diagnosis not present

## 2023-12-20 ENCOUNTER — Other Ambulatory Visit: Payer: Self-pay | Admitting: Internal Medicine

## 2023-12-20 DIAGNOSIS — M8589 Other specified disorders of bone density and structure, multiple sites: Secondary | ICD-10-CM | POA: Diagnosis not present

## 2023-12-20 DIAGNOSIS — M859 Disorder of bone density and structure, unspecified: Secondary | ICD-10-CM | POA: Diagnosis not present

## 2023-12-20 DIAGNOSIS — C679 Malignant neoplasm of bladder, unspecified: Secondary | ICD-10-CM

## 2023-12-20 DIAGNOSIS — M8580 Other specified disorders of bone density and structure, unspecified site: Secondary | ICD-10-CM | POA: Diagnosis not present

## 2024-01-08 DIAGNOSIS — C678 Malignant neoplasm of overlapping sites of bladder: Secondary | ICD-10-CM | POA: Diagnosis not present

## 2024-01-11 ENCOUNTER — Ambulatory Visit
Admission: RE | Admit: 2024-01-11 | Discharge: 2024-01-11 | Disposition: A | Discharge status: Home / Self Care | Source: Ambulatory Visit | Attending: Internal Medicine | Admitting: Internal Medicine

## 2024-01-11 DIAGNOSIS — C679 Malignant neoplasm of bladder, unspecified: Secondary | ICD-10-CM

## 2024-01-11 DIAGNOSIS — Z8551 Personal history of malignant neoplasm of bladder: Secondary | ICD-10-CM | POA: Diagnosis not present

## 2024-01-11 MED ORDER — IOPAMIDOL (ISOVUE-300) INJECTION 61%
500.0000 mL | Freq: Once | INTRAVENOUS | Status: AC | PRN
  Administered 2024-01-11: 80 mL via INTRAVENOUS

## 2024-01-15 DIAGNOSIS — Z5111 Encounter for antineoplastic chemotherapy: Secondary | ICD-10-CM | POA: Diagnosis not present

## 2024-01-15 DIAGNOSIS — C678 Malignant neoplasm of overlapping sites of bladder: Secondary | ICD-10-CM | POA: Diagnosis not present

## 2024-01-22 DIAGNOSIS — C678 Malignant neoplasm of overlapping sites of bladder: Secondary | ICD-10-CM | POA: Diagnosis not present

## 2024-02-06 DIAGNOSIS — M25512 Pain in left shoulder: Secondary | ICD-10-CM | POA: Diagnosis not present

## 2024-03-19 DIAGNOSIS — Z8551 Personal history of malignant neoplasm of bladder: Secondary | ICD-10-CM | POA: Diagnosis not present

## 2024-03-19 DIAGNOSIS — R3912 Poor urinary stream: Secondary | ICD-10-CM | POA: Diagnosis not present

## 2024-03-19 DIAGNOSIS — R972 Elevated prostate specific antigen [PSA]: Secondary | ICD-10-CM | POA: Diagnosis not present

## 2024-03-19 DIAGNOSIS — N401 Enlarged prostate with lower urinary tract symptoms: Secondary | ICD-10-CM | POA: Diagnosis not present

## 2024-03-19 DIAGNOSIS — R8289 Other abnormal findings on cytological and histological examination of urine: Secondary | ICD-10-CM | POA: Diagnosis not present

## 2024-04-04 DIAGNOSIS — M545 Low back pain, unspecified: Secondary | ICD-10-CM | POA: Diagnosis not present

## 2024-05-14 NOTE — Patient Instructions (Signed)
 SURGICAL WAITING ROOM VISITATION Patients having surgery or a procedure may have no more than 2 support people in the waiting area - these visitors may rotate in the visitor waiting room.   Due to an increase in RSV and influenza rates and associated hospitalizations, children ages 6 and under may not visit patients in [facility] hospitals. If the patient needs to stay at the hospital during part of their recovery, the visitor guidelines for inpatient rooms apply.  PRE-OP VISITATION  Pre-op nurse will coordinate an appropriate time for 1 support person to accompany the patient in pre-op.  This support person may not rotate.  This visitor will be contacted when the time is appropriate for the visitor to come back in the pre-op area.  Please refer to the [facility] website for the visitor guidelines for Inpatients (after your surgery is over and you are in a regular room).  You are not required to quarantine at this time prior to your surgery. However, you must do this: Hand Hygiene often Do NOT share personal items Notify your provider if you are in close contact with someone who has COVID or you develop fever 100.4 or greater, new onset of sneezing, cough, sore throat, shortness of breath or body aches.  If you test positive for Covid or have been in contact with anyone that has tested positive in the last 10 days please notify you surgeon.    Your procedure is scheduled on:  06/19/24  Report to [facility] Main Entrance: [place] entrance where the [employer] is available.   Report to admitting at: 9:45 AM  Call this number if you have any questions or problems the morning of surgery [phone]  FOLLOW ANY ADDITIONAL PRE OP INSTRUCTIONS YOU RECEIVED FROM YOUR SURGEON'S OFFICE!!!  Do not eat food after Midnight the night prior to your surgery/procedure.  After Midnight you may have the following liquids until: 9:15 AM DAY OF SURGERY  Clear Liquid Diet Water  Black  Coffee (sugar ok, NO MILK/CREAM OR CREAMERS)  Tea (sugar ok, NO MILK/CREAM OR CREAMERS) regular and decaf                             Plain Jell-O  with no fruit (NO RED)                                           Fruit ices (not with fruit pulp, NO RED)                                     Popsicles (NO RED)                                                                  Juice: NO CITRUS JUICES: only apple, WHITE grape, WHITE cranberry Sports drinks like Gatorade or Powerade (NO RED)   The day of surgery:  Drink ONE (1) Pre-Surgery Clear Ensure at : 9:15 AM the morning of surgery. Drink in one sitting. Do not sip.  This drink was given  to you during your hospital pre-op appointment visit. Nothing else to drink after completing the Pre-Surgery Clear Ensure or G2 : No candy, chewing gum or throat lozenges.   Oral Hygiene is also important to reduce your risk of infection.        Remember - BRUSH YOUR TEETH THE MORNING OF SURGERY WITH YOUR REGULAR TOOTHPASTE  Do NOT smoke after Midnight the night before surgery.  STOP TAKING all Vitamins, Herbs and supplements 1 week before your surgery.   Take ONLY these medicines the morning of surgery with A SIP OF WATER : famotidine.Tylenol  as needed. Use eye drops as usual.                   You may not have any metal on your body including hair pins, jewelry, and body piercing  Do not wear lotions, powders, perfumes / cologne, or deodorant.  Men may shave face and neck.  Contacts, Hearing Aids, dentures or bridgework may not be worn into surgery. DENTURES WILL BE REMOVED PRIOR TO SURGERY PLEASE DO NOT APPLY Poly grip OR ADHESIVES!!!  You may bring a small overnight bag with you on the day of surgery, only pack items that are not valuable. [facility] IS NOT RESPONSIBLE   FOR VALUABLES THAT ARE LOST OR STOLEN.   Patients discharged on the day of surgery will not be allowed to drive home.  Someone NEEDS to stay with you for the first 24 hours after  anesthesia.  Do not bring your home medications to the hospital. The Pharmacy will dispense medications listed on your medication list to you during your admission in the Hospital.  Special Instructions: Bring a copy of your healthcare power of attorney and living will documents the day of surgery, if you wish to have them scanned into your [facility] Records- EPIC  Please read over the following fact sheets you were given: IF YOU HAVE QUESTIONS ABOUT YOUR PRE-OP INSTRUCTIONS, PLEASE CALL [phone]   [place]- Preparing for Total Shoulder Arthroplasty    Before surgery, you can play an important role. Because skin is not sterile, your skin needs to be as free of germs as possible. You can reduce the number of germs on your skin by using the following products. Benzoyl Peroxide Gel Reduces the number of germs present on the skin Applied twice a day to shoulder area starting two days before surgery    ==================================================================  Please follow these instructions carefully:  BENZOYL PEROXIDE 5% GEL  Please do not use if you have an allergy to benzoyl peroxide.   If your skin becomes reddened/irritated stop using the benzoyl peroxide.  Starting two days before surgery, apply as follows: Apply benzoyl peroxide in the morning and at night. Apply after taking a shower. If you are not taking a shower clean entire shoulder front, back, and side along with the armpit with a clean wet washcloth.  Place a quarter-sized dollop on your shoulder and rub in thoroughly, making sure to cover the front, back, and side of your shoulder, along with the armpit.   2 days before ____ AM   ____ PM              1 day before ____ AM   ____ PM                         Do this twice a day for two days.  (Last application is the night before surgery, AFTER  using the CHG soap as described below).  Do NOT apply benzoyl peroxide gel on the day of  surgery.  PATIENT SIGNATURE_________________________________  NURSE SIGNATURE__________________________________  ________________________________________________________________________    Pre-operative 5 CHG Bath Instructions   You can play a key role in reducing the risk of infection after surgery. Your skin needs to be as free of germs as possible. You can reduce the number of germs on your skin by washing with CHG (chlorhexidine  gluconate) soap before surgery. CHG is an antiseptic soap that kills germs and continues to kill germs even after washing.   DO NOT use if you have an allergy to chlorhexidine /CHG or antibacterial soaps. If your skin becomes reddened or irritated, stop using the CHG and notify one of our RNs at [phone].   Please shower with the CHG soap starting 4 days before surgery using the following schedule:     Please keep in mind the following:  DO NOT shave, including legs and underarms, starting the day of your first shower.   You may shave your face at any point before/day of surgery.  Place clean sheets on your bed the day you start using CHG soap. Use a clean washcloth (not used since being washed) for each shower. DO NOT sleep with pets once you start using the CHG.   CHG Shower Instructions:  If you choose to wash your hair and private area, wash first with your normal shampoo/soap.  After you use shampoo/soap, rinse your hair and body thoroughly to remove shampoo/soap residue.  Turn the water  OFF and apply about 3 tablespoons (45 ml) of CHG soap to a CLEAN washcloth.  Apply CHG soap ONLY FROM YOUR NECK DOWN TO YOUR TOES (washing for 3-5 minutes)  DO NOT use CHG soap on face, private areas, open wounds, or sores.  Pay special attention to the area where your surgery is being performed.  If you are having back surgery, having someone wash your back for you may be helpful. Wait 2 minutes after CHG soap is applied, then you may rinse off the CHG soap.   Pat dry with a clean towel  Put on clean clothes/pajamas   If you choose to wear lotion, please use ONLY the CHG-compatible lotions on the back of this paper.     Additional instructions for the day of surgery: DO NOT APPLY any lotions, deodorants, cologne, or perfumes.   Put on clean/comfortable clothes.  Brush your teeth.  Ask your nurse before applying any prescription medications to the skin.   CHG Compatible Lotions   Aveeno Moisturizing lotion  Cetaphil Moisturizing Cream  Cetaphil Moisturizing Lotion  Clairol Herbal Essence Moisturizing Lotion, Dry Skin  Clairol Herbal Essence Moisturizing Lotion, Extra Dry Skin  Clairol Herbal Essence Moisturizing Lotion, Normal Skin  Curel Age Defying Therapeutic Moisturizing Lotion with Alpha Hydroxy  Curel Extreme Care Body Lotion  Curel Soothing Hands Moisturizing Hand Lotion  Curel Therapeutic Moisturizing Cream, Fragrance-Free  Curel Therapeutic Moisturizing Lotion, Fragrance-Free  Curel Therapeutic Moisturizing Lotion, Original Formula  Eucerin Daily Replenishing Lotion  Eucerin Dry Skin Therapy Plus Alpha Hydroxy Crme  Eucerin Dry Skin Therapy Plus Alpha Hydroxy Lotion  Eucerin Original Crme  Eucerin Original Lotion  Eucerin Plus Crme Eucerin Plus Lotion  Eucerin TriLipid Replenishing Lotion  Keri Anti-Bacterial Hand Lotion  Keri Deep Conditioning Original Lotion Dry Skin Formula Softly Scented  Keri Deep Conditioning Original Lotion, Fragrance Free Sensitive Skin Formula  Keri Lotion Fast Absorbing Fragrance Free Sensitive Skin Formula  Keri Lotion Fast  Absorbing Softly Scented Dry Skin Formula  Keri Original Lotion  Keri Skin Renewal Lotion Keri Silky Smooth Lotion  Keri Silky Smooth Sensitive Skin Lotion  Nivea Body Creamy Conditioning Oil  Nivea Body Extra Enriched Lotion  Nivea Body Original Lotion  Nivea Body Sheer Moisturizing Lotion Nivea Crme  Nivea Skin Firming Lotion  NutraDerm 30 Skin Lotion  NutraDerm  Skin Lotion  NutraDerm Therapeutic Skin Cream  NutraDerm Therapeutic Skin Lotion  ProShield Protective Hand Cream  Provon moisturizing lotion   Incentive Spirometer  An incentive spirometer is a tool that can help keep your lungs clear and active. This tool measures how well you are filling your lungs with each breath. Taking long deep breaths may help reverse or decrease the chance of developing breathing (pulmonary) problems (especially infection) following: A long period of time when you are unable to move or be active. BEFORE THE PROCEDURE  If the spirometer includes an indicator to show your best effort, your nurse or respiratory therapist will set it to a desired goal. If possible, sit up straight or lean slightly forward. Try not to slouch. Hold the incentive spirometer in an upright position. INSTRUCTIONS FOR USE  Sit on the edge of your bed if possible, or sit up as far as you can in bed or on a chair. Hold the incentive spirometer in an upright position. Breathe out normally. Place the mouthpiece in your mouth and seal your lips tightly around it. Breathe in slowly and as deeply as possible, raising the piston or the ball toward the top of the column. Hold your breath for 3-5 seconds or for as long as possible. Allow the piston or ball to fall to the bottom of the column. Remove the mouthpiece from your mouth and breathe out normally. Rest for a few seconds and repeat Steps 1 through 7 at least 10 times every 1-2 hours when you are awake. Take your time and take a few normal breaths between deep breaths. The spirometer may include an indicator to show your best effort. Use the indicator as a goal to work toward during each repetition. After each set of 10 deep breaths, practice coughing to be sure your lungs are clear. If you have an incision (the cut made at the time of surgery), support your incision when coughing by placing a pillow or rolled up towels firmly against it. Once  you are able to get out of bed, walk around indoors and cough well. You may stop using the incentive spirometer when instructed by your caregiver.  RISKS AND COMPLICATIONS Take your time so you do not get dizzy or light-headed. If you are in pain, you may need to take or ask for pain medication before doing incentive spirometry. It is harder to take a deep breath if you are having pain. AFTER USE Rest and breathe slowly and easily. It can be helpful to keep track of a log of your progress. Your caregiver can provide you with a simple table to help with this. If you are using the spirometer at home, follow these instructions: SEEK MEDICAL CARE IF:  You are having difficultly using the spirometer. You have trouble using the spirometer as often as instructed. Your pain medication is not giving enough relief while using the spirometer. You develop fever of 100.5 F (38.1 C) or higher. SEEK IMMEDIATE MEDICAL CARE IF:  You cough up bloody sputum that had not been present before. You develop fever of 102 F (38.9 C) or greater. You develop  worsening pain at or near the incision site. MAKE SURE YOU:  Understand these instructions. Will watch your condition. Will get help right away if you are not doing well or get worse. Document Released: 01/08/2007 Document Revised: 11/20/2011 Document Reviewed: 03/11/2007 [facility] Patient Information 2014 [place], [place].   ________________________________________________________________________

## 2024-06-09 ENCOUNTER — Encounter (HOSPITAL_COMMUNITY)
Admission: RE | Admit: 2024-06-09 | Discharge: 2024-06-09 | Disposition: A | Discharge status: Home / Self Care | Source: Ambulatory Visit | Attending: Orthopedic Surgery | Admitting: Orthopedic Surgery

## 2024-06-09 ENCOUNTER — Encounter (HOSPITAL_COMMUNITY): Payer: Self-pay

## 2024-06-09 ENCOUNTER — Other Ambulatory Visit: Payer: Self-pay

## 2024-06-09 VITALS — BP 119/72 | HR 57 | Temp 97.7°F | Ht 62.0 in | Wt 126.0 lb

## 2024-06-09 DIAGNOSIS — I10 Essential (primary) hypertension: Secondary | ICD-10-CM | POA: Diagnosis not present

## 2024-06-09 DIAGNOSIS — Z01818 Encounter for other preprocedural examination: Secondary | ICD-10-CM | POA: Insufficient documentation

## 2024-06-09 LAB — BASIC METABOLIC PANEL WITH GFR
Anion gap: 10 (ref 5–15)
BUN: 12 mg/dL (ref 8–23)
CO2: 23 mmol/L (ref 22–32)
Calcium: 9.2 mg/dL (ref 8.9–10.3)
Chloride: 105 mmol/L (ref 98–111)
Creatinine, Ser: 0.87 mg/dL (ref 0.61–1.24)
GFR, Estimated: >60 mL/min (ref >60)
Glucose, Bld: 93 mg/dL (ref 70–99)
Potassium: 4.4 mmol/L (ref 3.5–5.1)
Sodium: 138 mmol/L (ref 135–145)

## 2024-06-09 LAB — CBC
HCT: 50.1 % (ref 39.0–52.0)
Hemoglobin: 16.7 g/dL (ref 13.0–17.0)
MCH: 32.1 pg (ref 26.0–34.0)
MCHC: 33.3 g/dL (ref 30.0–36.0)
MCV: 96.2 fL (ref 80.0–100.0)
Platelets: 215 K/uL (ref 150–400)
RBC: 5.21 MIL/uL (ref 4.22–5.81)
RDW: 12.4 % (ref 11.5–15.5)
WBC: 5.4 K/uL (ref 4.0–10.5)
nRBC: 0 % (ref 0.0–0.2)

## 2024-06-09 LAB — SURGICAL PCR SCREEN
MRSA, PCR: NEGATIVE
Staphylococcus aureus: NEGATIVE

## 2024-06-09 NOTE — Progress Notes (Signed)
 For Anesthesia: PCP - Loreli Elsie JONETTA Mickey., MD  Cardiologist - N/A  Bowel Prep reminder:  Chest x-ray -  EKG - 06/09/24 Stress Test -  ECHO -  Cardiac Cath -  Pacemaker/ICD device last checked: Pacemaker orders received: Device Rep notified:  Spinal Cord Stimulator: N/A  Sleep Study - N/A CPAP -   Fasting Blood Sugar - N/A Checks Blood Sugar _____ times a day Date and result of last Hgb A1c-  Last dose of GLP1 agonist- N/A GLP1 instructions: Hold 7 days prior to schedule (Hold 24 hours-daily)   Last dose of SGLT-2 inhibitors- N/A SGLT-2 instructions: Hold 72 hours prior to surgery  Blood Thinner Instructions:N/A Last Dose: Time last taken:  Aspirin Instructions:N/A Last Dose: Time last taken:  Activity level: Can go up a flight of stairs and activities of daily living without stopping and without chest pain and/or shortness of breath   Able to exercise without chest pain and/or shortness of breath  Anesthesia review: Hx: HTN  Patient denies shortness of breath, fever, cough and chest pain at PAT appointment   Patient verbalized understanding of instructions that were reviewed over the telephone.

## 2024-06-19 LAB — TYPE AND SCREEN
ABO/RH(D): A POS
Antibody Screen: NEGATIVE

## 2024-06-24 NOTE — Progress Notes (Addendum)
 PT called on 06/24/24 due to change in date and time of surgery. PT still has hibiclens  soap, instructons and presurgery drink.  Reviewed with pt date and time of arrival along with surgery time.  PT voiced understanding.  Also reviewed instructons with dates in regards to benzoyl peroxide gel and hibiclens  showers.  PT voiced understanding  PT also aware of what time to consume Ensure presurgery drink.  Chart restickered.  Noted changes on original preop instructions.   PT stated at time of phone call he has noted some slight blood in urine on 06/21/24.  PT stated he notifed DR Renda office on 06/23/24.  With no new orders given.  PT aware to monitor and if becomes worse or develops fever or burning with urination to notify DR Renda office again  PT voiced understanding.

## 2024-07-03 ENCOUNTER — Encounter (HOSPITAL_COMMUNITY)

## 2024-07-09 NOTE — Progress Notes (Signed)
 1700 Left message about time change to 0730 to arrive at 0515 and go to Admitting.  NPO after midnight. Take medication and drink pre surgery drink by 0430. Asked to call back to Short Stay before 1900 today.

## 2024-07-09 NOTE — Progress Notes (Signed)
 1815 Call returned reported he received the message about his surgery time change.  He will arrive at 0515 to Admitting.  Plans to take medication and drink pre surgery drink by 0430.

## 2024-07-09 NOTE — Anesthesia Preprocedure Evaluation (Signed)
 Anesthesia Evaluation  Patient identified by MRN, date of birth, ID band Patient awake    Reviewed: Allergy & Precautions, NPO status , Patient's Chart, lab work & pertinent test results  History of Anesthesia Complications Negative for: history of anesthetic complications  Airway Mallampati: II  TM Distance: >3 FB Neck ROM: Full    Dental no notable dental hx.    Pulmonary former smoker Quit smoking 1992   Pulmonary exam normal        Cardiovascular hypertension, Pt. on medications Normal cardiovascular exam     Neuro/Psych negative neurological ROS  negative psych ROS   GI/Hepatic Neg liver ROS,GERD  Medicated and Controlled,,  Endo/Other  negative endocrine ROS    Renal/GU negative Renal ROS  negative genitourinary   Musculoskeletal  (+) Arthritis , Osteoarthritis,    Abdominal   Peds  Hematology negative hematology ROS (+)   Anesthesia Other Findings   Reproductive/Obstetrics negative OB ROS                              Anesthesia Physical Anesthesia Plan  ASA: 3  Anesthesia Plan: General   Post-op Pain Management: Regional block* and Tylenol  PO (pre-op)*   Induction: Intravenous  PONV Risk Score and Plan: 2 and Treatment may vary due to age or medical condition, Ondansetron  and Dexamethasone   Airway Management Planned: Oral ETT and Video Laryngoscope Planned  Additional Equipment: None  Intra-op Plan:   Post-operative Plan:   Informed Consent: I have reviewed the patients History and Physical, chart, labs and discussed the procedure including the risks, benefits and alternatives for the proposed anesthesia with the patient or authorized representative who has indicated his/her understanding and acceptance.     Dental advisory given  Plan Discussed with: CRNA  Anesthesia Plan Comments: (Last intubation marked a difficult airway (02/2023), unsure why: Induction  Type: IV induction Laryngoscope Size: Mac and 3 Grade View: Grade II Tube type: Oral Tube size: 7.0 mm Number of attempts: 2 )         Anesthesia Quick Evaluation

## 2024-07-10 ENCOUNTER — Encounter (HOSPITAL_COMMUNITY)
Admission: RE | Disposition: A | Discharge status: Home / Self Care | Payer: Self-pay | Source: Ambulatory Visit | Attending: Orthopedic Surgery

## 2024-07-10 ENCOUNTER — Ambulatory Visit (HOSPITAL_COMMUNITY)
Admission: RE | Admit: 2024-07-10 | Discharge: 2024-07-10 | Disposition: A | Discharge status: Home / Self Care | Source: Ambulatory Visit | Attending: Orthopedic Surgery | Admitting: Orthopedic Surgery

## 2024-07-10 ENCOUNTER — Ambulatory Visit (HOSPITAL_COMMUNITY): Payer: Self-pay | Admitting: Anesthesiology

## 2024-07-10 ENCOUNTER — Encounter (HOSPITAL_COMMUNITY): Payer: Self-pay | Admitting: Orthopedic Surgery

## 2024-07-10 ENCOUNTER — Ambulatory Visit (HOSPITAL_COMMUNITY): Payer: Self-pay | Admitting: Physician Assistant

## 2024-07-10 DIAGNOSIS — I10 Essential (primary) hypertension: Secondary | ICD-10-CM | POA: Diagnosis not present

## 2024-07-10 DIAGNOSIS — M19012 Primary osteoarthritis, left shoulder: Secondary | ICD-10-CM

## 2024-07-10 DIAGNOSIS — Z79899 Other long term (current) drug therapy: Secondary | ICD-10-CM | POA: Insufficient documentation

## 2024-07-10 DIAGNOSIS — Z96611 Presence of right artificial shoulder joint: Secondary | ICD-10-CM | POA: Insufficient documentation

## 2024-07-10 DIAGNOSIS — K219 Gastro-esophageal reflux disease without esophagitis: Secondary | ICD-10-CM | POA: Diagnosis not present

## 2024-07-10 DIAGNOSIS — Z87891 Personal history of nicotine dependence: Secondary | ICD-10-CM | POA: Insufficient documentation

## 2024-07-10 DIAGNOSIS — Z8249 Family history of ischemic heart disease and other diseases of the circulatory system: Secondary | ICD-10-CM | POA: Diagnosis not present

## 2024-07-10 DIAGNOSIS — M25712 Osteophyte, left shoulder: Secondary | ICD-10-CM | POA: Diagnosis not present

## 2024-07-10 DIAGNOSIS — G8918 Other acute postprocedural pain: Secondary | ICD-10-CM | POA: Diagnosis not present

## 2024-07-10 HISTORY — PX: REVERSE SHOULDER ARTHROPLASTY: SHX5054

## 2024-07-10 LAB — TYPE AND SCREEN
ABO/RH(D): A POS
Antibody Screen: NEGATIVE

## 2024-07-10 SURGERY — ARTHROPLASTY, SHOULDER, TOTAL, REVERSE
Anesthesia: General | Site: Shoulder | Laterality: Left

## 2024-07-10 MED ORDER — SUGAMMADEX SODIUM 200 MG/2ML IV SOLN
INTRAVENOUS | Status: DC | PRN
  Administered 2024-07-10: 200 mg via INTRAVENOUS

## 2024-07-10 MED ORDER — FENTANYL CITRATE (PF) 50 MCG/ML IJ SOSY
PREFILLED_SYRINGE | INTRAMUSCULAR | Status: AC
  Filled 2024-07-10: qty 2

## 2024-07-10 MED ORDER — PROPOFOL 10 MG/ML IV BOLUS
INTRAVENOUS | Status: DC | PRN
  Administered 2024-07-10: 90 mg via INTRAVENOUS

## 2024-07-10 MED ORDER — EPHEDRINE SULFATE (PRESSORS) 25 MG/5ML IV SOSY
PREFILLED_SYRINGE | INTRAVENOUS | Status: DC | PRN
  Administered 2024-07-10: 10 mg via INTRAVENOUS
  Administered 2024-07-10 (×2): 5 mg via INTRAVENOUS
  Administered 2024-07-10: 10 mg via INTRAVENOUS

## 2024-07-10 MED ORDER — VANCOMYCIN HCL 1000 MG IV SOLR
INTRAVENOUS | Status: DC | PRN
Start: 2024-07-10 — End: 2024-07-10
  Administered 2024-07-10: 1000 mg via TOPICAL

## 2024-07-10 MED ORDER — VANCOMYCIN HCL 1000 MG IV SOLR
INTRAVENOUS | Status: AC
  Filled 2024-07-10: qty 20

## 2024-07-10 MED ORDER — TRANEXAMIC ACID 1000 MG/10ML IV SOLN: 1000.0000 mg | INTRAVENOUS | Status: DC

## 2024-07-10 MED ORDER — BUPIVACAINE-EPINEPHRINE (PF) 0.5% -1:200000 IJ SOLN
INTRAMUSCULAR | Status: DC | PRN
Start: 2024-07-10 — End: 2024-07-10
  Administered 2024-07-10: 15 mL via PERINEURAL

## 2024-07-10 MED ORDER — ONDANSETRON HCL 4 MG PO TABS: 4.0000 mg | ORAL_TABLET | Freq: Three times a day (TID) | ORAL | 0 refills | Status: AC | PRN

## 2024-07-10 MED ORDER — STERILE WATER FOR IRRIGATION IR SOLN
Status: DC | PRN
  Administered 2024-07-10: 2000 mL

## 2024-07-10 MED ORDER — ROCURONIUM BROMIDE 10 MG/ML (PF) SYRINGE
PREFILLED_SYRINGE | INTRAVENOUS | Status: DC | PRN
  Administered 2024-07-10: 20 mg via INTRAVENOUS
  Administered 2024-07-10: 40 mg via INTRAVENOUS

## 2024-07-10 MED ORDER — ONDANSETRON HCL 4 MG/2ML IJ SOLN
INTRAMUSCULAR | Status: AC
  Filled 2024-07-10: qty 2

## 2024-07-10 MED ORDER — METHOCARBAMOL 1000 MG/10ML IJ SOLN
INTRAMUSCULAR | Status: AC
  Filled 2024-07-10: qty 10

## 2024-07-10 MED ORDER — METOCLOPRAMIDE HCL 5 MG PO TABS: 5.0000 mg | ORAL_TABLET | Freq: Three times a day (TID) | ORAL | Status: DC | PRN

## 2024-07-10 MED ORDER — FENTANYL CITRATE (PF) 100 MCG/2ML IJ SOLN
INTRAMUSCULAR | Status: DC | PRN
  Administered 2024-07-10 (×2): 25 ug via INTRAVENOUS

## 2024-07-10 MED ORDER — ORAL CARE MOUTH RINSE: 15.0000 mL | Freq: Once | OROMUCOSAL | Status: AC

## 2024-07-10 MED ORDER — OXYCODONE HCL 5 MG PO TABS
5.0000 mg | ORAL_TABLET | Freq: Once | ORAL | Status: AC | PRN
  Administered 2024-07-10: 5 mg via ORAL

## 2024-07-10 MED ORDER — SUGAMMADEX SODIUM 200 MG/2ML IV SOLN
INTRAVENOUS | Status: AC
  Filled 2024-07-10: qty 2

## 2024-07-10 MED ORDER — FENTANYL CITRATE (PF) 50 MCG/ML IJ SOSY
PREFILLED_SYRINGE | INTRAMUSCULAR | Status: AC
  Filled 2024-07-10: qty 1

## 2024-07-10 MED ORDER — LIDOCAINE HCL (PF) 2 % IJ SOLN
INTRAMUSCULAR | Status: AC
  Filled 2024-07-10: qty 5

## 2024-07-10 MED ORDER — PROPOFOL 10 MG/ML IV BOLUS
INTRAVENOUS | Status: AC
  Filled 2024-07-10: qty 20

## 2024-07-10 MED ORDER — METOCLOPRAMIDE HCL 5 MG/ML IJ SOLN: 5.0000 mg | Freq: Three times a day (TID) | INTRAMUSCULAR | Status: DC | PRN

## 2024-07-10 MED ORDER — 0.9 % SODIUM CHLORIDE (POUR BTL) OPTIME
TOPICAL | Status: DC | PRN
  Administered 2024-07-10: 1000 mL

## 2024-07-10 MED ORDER — CHLORHEXIDINE GLUCONATE 0.12 % MT SOLN
15.0000 mL | Freq: Once | OROMUCOSAL | Status: AC
  Administered 2024-07-10: 15 mL via OROMUCOSAL

## 2024-07-10 MED ORDER — METHOCARBAMOL 1000 MG/10ML IJ SOLN
500.0000 mg | Freq: Once | INTRAMUSCULAR | Status: AC
  Administered 2024-07-10: 500 mg via INTRAVENOUS

## 2024-07-10 MED ORDER — DEXAMETHASONE SOD PHOSPHATE PF 10 MG/ML IJ SOLN
INTRAMUSCULAR | Status: DC | PRN
  Administered 2024-07-10: 8 mg via INTRAVENOUS

## 2024-07-10 MED ORDER — BUPIVACAINE LIPOSOME 1.3 % IJ SUSP
INTRAMUSCULAR | Status: DC | PRN
Start: 2024-07-10 — End: 2024-07-10
  Administered 2024-07-10: 10 mL via PERINEURAL

## 2024-07-10 MED ORDER — OXYCODONE-ACETAMINOPHEN 5-325 MG PO TABS: 1.0000 | ORAL_TABLET | ORAL | 0 refills | Status: AC | PRN

## 2024-07-10 MED ORDER — EPHEDRINE 5 MG/ML INJ
INTRAVENOUS | Status: AC
  Filled 2024-07-10: qty 5

## 2024-07-10 MED ORDER — MELOXICAM 15 MG PO TABS: 15.0000 mg | ORAL_TABLET | Freq: Every day | ORAL | 2 refills | Status: AC

## 2024-07-10 MED ORDER — CEFAZOLIN SODIUM-DEXTROSE 2-4 GM/100ML-% IV SOLN
2.0000 g | INTRAVENOUS | Status: AC
  Administered 2024-07-10: 2 g via INTRAVENOUS
  Filled 2024-07-10: qty 100

## 2024-07-10 MED ORDER — PHENYLEPHRINE HCL-NACL 20-0.9 MG/250ML-% IV SOLN
INTRAVENOUS | Status: DC | PRN
  Administered 2024-07-10: 40 ug/min via INTRAVENOUS

## 2024-07-10 MED ORDER — LIDOCAINE 2% (20 MG/ML) 5 ML SYRINGE
INTRAMUSCULAR | Status: DC | PRN
  Administered 2024-07-10: 50 mg via INTRAVENOUS

## 2024-07-10 MED ORDER — CYCLOBENZAPRINE HCL 10 MG PO TABS: 10.0000 mg | ORAL_TABLET | Freq: Three times a day (TID) | ORAL | 1 refills | Status: AC | PRN

## 2024-07-10 MED ORDER — OXYCODONE HCL 5 MG PO TABS
ORAL_TABLET | ORAL | Status: AC
  Filled 2024-07-10: qty 1

## 2024-07-10 MED ORDER — ACETAMINOPHEN 500 MG PO TABS
1000.0000 mg | ORAL_TABLET | Freq: Once | ORAL | Status: DC
  Filled 2024-07-10: qty 2

## 2024-07-10 MED ORDER — FENTANYL CITRATE (PF) 100 MCG/2ML IJ SOLN
INTRAMUSCULAR | Status: AC
  Filled 2024-07-10: qty 2

## 2024-07-10 MED ORDER — PHENYLEPHRINE 80 MCG/ML (10ML) SYRINGE FOR IV PUSH (FOR BLOOD PRESSURE SUPPORT)
PREFILLED_SYRINGE | INTRAVENOUS | Status: DC | PRN
  Administered 2024-07-10: 160 ug via INTRAVENOUS

## 2024-07-10 MED ORDER — ACETAMINOPHEN 500 MG PO TABS: 1000.0000 mg | ORAL_TABLET | Freq: Once | ORAL | Status: DC

## 2024-07-10 MED ORDER — LACTATED RINGERS IV SOLN: INTRAVENOUS | Status: DC

## 2024-07-10 MED ORDER — FENTANYL CITRATE (PF) 50 MCG/ML IJ SOSY
25.0000 ug | PREFILLED_SYRINGE | INTRAMUSCULAR | Status: DC | PRN
  Administered 2024-07-10 (×2): 50 ug via INTRAVENOUS

## 2024-07-10 MED ORDER — TRANEXAMIC ACID-NACL 1000-0.7 MG/100ML-% IV SOLN
1000.0000 mg | INTRAVENOUS | Status: AC
Start: 2024-07-10 — End: 2024-07-10
  Administered 2024-07-10: 1000 mg via INTRAVENOUS
  Filled 2024-07-10: qty 100

## 2024-07-10 MED ORDER — ONDANSETRON HCL 4 MG/2ML IJ SOLN
INTRAMUSCULAR | Status: DC | PRN
  Administered 2024-07-10: 4 mg via INTRAVENOUS

## 2024-07-10 MED ORDER — DROPERIDOL 2.5 MG/ML IJ SOLN: 0.6250 mg | Freq: Once | INTRAMUSCULAR | Status: DC | PRN

## 2024-07-10 MED ORDER — ROCURONIUM BROMIDE 10 MG/ML (PF) SYRINGE
PREFILLED_SYRINGE | INTRAVENOUS | Status: AC
  Filled 2024-07-10: qty 10

## 2024-07-10 MED ORDER — OXYCODONE HCL 5 MG/5ML PO SOLN: 5.0000 mg | Freq: Once | ORAL | Status: AC | PRN

## 2024-07-10 SURGICAL SUPPLY — 58 items
BAG COUNTER SPONGE SURGICOUNT (BAG) IMPLANT
BAG ZIPLOCK 12X15 (MISCELLANEOUS) ×1 IMPLANT
BIT DRILL AR 3 NS (BIT) IMPLANT
BLADE SAW SGTL 83.5X18.5 (BLADE) ×1 IMPLANT
BNDG COHESIVE 4X5 TAN STRL LF (GAUZE/BANDAGES/DRESSINGS) ×1 IMPLANT
COOLER ICEMAN CLASSIC (MISCELLANEOUS) ×1 IMPLANT
COVER BACK TABLE 60X90IN (DRAPES) ×1 IMPLANT
COVER SURGICAL LIGHT HANDLE (MISCELLANEOUS) ×1 IMPLANT
CUP SUT UNIV REVERS 39 NEU (Shoulder) IMPLANT
DRAPE SHEET LG 3/4 BI-LAMINATE (DRAPES) ×1 IMPLANT
DRAPE SURG 17X11 SM STRL (DRAPES) ×1 IMPLANT
DRAPE SURG ORHT 6 SPLT 77X108 (DRAPES) ×2 IMPLANT
DRAPE TOP 10253 STERILE (DRAPES) ×1 IMPLANT
DRAPE U-SHAPE 47X51 STRL (DRAPES) ×1 IMPLANT
DRESSING AQUACEL AG SP 3.5X6 (GAUZE/BANDAGES/DRESSINGS) ×1 IMPLANT
DURAPREP 26ML APPLICATOR (WOUND CARE) ×1 IMPLANT
ELECT BLADE TIP CTD 4 INCH (ELECTRODE) ×1 IMPLANT
ELECT PENCIL ROCKER SW 15FT (MISCELLANEOUS) ×1 IMPLANT
ELECT REM PT RETURN 15FT ADLT (MISCELLANEOUS) ×1 IMPLANT
FACESHIELD WRAPAROUND OR TEAM (MASK) ×5 IMPLANT
GLENOID UNI REV MOD 24 +2 LAT (Joint) IMPLANT
GLENOSPHERE 39+4 LAT/24 UNI RV (Joint) IMPLANT
GLOVE BIO SURGEON STRL SZ7.5 (GLOVE) ×1 IMPLANT
GLOVE BIO SURGEON STRL SZ8 (GLOVE) ×1 IMPLANT
GLOVE SS BIOGEL STRL SZ 7 (GLOVE) ×1 IMPLANT
GLOVE SS BIOGEL STRL SZ 7.5 (GLOVE) ×1 IMPLANT
GOWN STRL SURGICAL XL XLNG (GOWN DISPOSABLE) ×2 IMPLANT
INSERT HUMERAL 39/+6 (Insert) IMPLANT
KIT BASIN OR (CUSTOM PROCEDURE TRAY) ×1 IMPLANT
KIT TURNOVER KIT A (KITS) ×1 IMPLANT
MANIFOLD NEPTUNE II (INSTRUMENTS) ×1 IMPLANT
NDL TAPERED W/ NITINOL LOOP (MISCELLANEOUS) ×1 IMPLANT
NEEDLE TAPERED W/ NITINOL LOOP (MISCELLANEOUS) ×1 IMPLANT
NS IRRIG 1000ML POUR BTL (IV SOLUTION) ×1 IMPLANT
PACK SHOULDER (CUSTOM PROCEDURE TRAY) ×1 IMPLANT
PAD ARMBOARD POSITIONER FOAM (MISCELLANEOUS) ×1 IMPLANT
PAD COLD SHLDR WRAP-ON (PAD) ×1 IMPLANT
PIN NITINOL TARGETER 2.8 (PIN) IMPLANT
PIN SET MODULAR GLENOID SYSTEM (PIN) IMPLANT
RESTRAINT HEAD UNIVERSAL NS (MISCELLANEOUS) ×1 IMPLANT
SCREW CENTRAL MOD 30MM (Screw) IMPLANT
SCREW PERI LOCK 5.5X16 (Screw) IMPLANT
SCREW PERI LOCK 5.5X32 (Screw) IMPLANT
SCREW PERI LOCK 5.5X36 (Screw) IMPLANT
SCREW PERIPHERAL 5.5X20 LOCK (Screw) IMPLANT
SLING ARM FOAM STRAP LRG (SOFTGOODS) IMPLANT
SLING ARM FOAM STRAP MED (SOFTGOODS) IMPLANT
STEM HUMERAL UNI REVERS SZ9 (Stem) IMPLANT
STRIP CLOSURE SKIN 1/2X4 (GAUZE/BANDAGES/DRESSINGS) ×1 IMPLANT
SUT MNCRL AB 3-0 PS2 18 (SUTURE) ×1 IMPLANT
SUT MON AB 2-0 CT1 36 (SUTURE) ×1 IMPLANT
SUT VIC AB 1 CT1 36 (SUTURE) ×1 IMPLANT
SUTURE TAPE 1.3 40 TPR END (SUTURE) ×2 IMPLANT
TOWEL GREEN STERILE FF (TOWEL DISPOSABLE) ×1 IMPLANT
TOWEL OR 17X26 10 PK STRL BLUE (TOWEL DISPOSABLE) ×1 IMPLANT
TUBE SUCTION HIGH CAP CLEAR NV (SUCTIONS) ×1 IMPLANT
TUBING CONNECTING 10 (TUBING) ×1 IMPLANT
WATER STERILE IRR 1000ML POUR (IV SOLUTION) ×2 IMPLANT

## 2024-07-10 NOTE — Transfer of Care (Signed)
 Immediate Anesthesia Transfer of Care Note  Patient: Todd Myers  Procedure(s) Performed: LEFT REVERSE TOTAL SHOULDER ARTHROPLASTY (Left: Shoulder)  Patient Location: PACU  Anesthesia Type:General  Level of Consciousness: drowsy and patient cooperative  Airway & Oxygen Therapy: Patient Spontanous Breathing and Patient connected to face mask oxygen  Post-op Assessment: Report given to RN and Post -op Vital signs reviewed and stable  Post vital signs: Reviewed and stable  Last Vitals:  Vitals Value Taken Time  BP 140/74 07/10/24 09:25  Temp    Pulse 80 07/10/24 09:27  Resp 14 07/10/24 09:27  SpO2 99 % 07/10/24 09:27  Vitals shown include unfiled device data.  Last Pain:  Vitals:   07/10/24 0600  TempSrc: Oral  PainSc: 0-No pain         Complications: No notable events documented.

## 2024-07-10 NOTE — Anesthesia Procedure Notes (Signed)
 Anesthesia Regional Block: Interscalene brachial plexus block   Pre-Anesthetic Checklist: , timeout performed,  Correct Patient, Correct Site, Correct Laterality,  Correct Procedure, Correct Position, site marked,  Risks and benefits discussed,  Pre-op evaluation,  At surgeon's request and post-op pain management  Laterality: Left  Prep: Maximum Sterile Barrier Precautions used, chloraprep       Needles:  Injection technique: Single-shot  Needle Type: Echogenic Stimulator Needle     Needle Length: 4cm  Needle Gauge: 22     Additional Needles:   Procedures:,,,, ultrasound used (permanent image in chart),,    Narrative:  Start time: 07/10/2024 7:08 AM End time: 07/10/2024 7:11 AM Injection made incrementally with aspirations every 5 mL.  Performed by: Personally  Anesthesiologist: Paul Lamarr BRAVO, MD  Additional Notes: Risks, benefits, and alternative discussed. Patient gave consent for procedure. Patient prepped and draped in sterile fashion. Sedation administered, patient remains easily responsive to voice. Relevant anatomy identified with ultrasound guidance. Local anesthetic given in 5cc increments with no signs or symptoms of intravascular injection. No pain or paraesthesias with injection. Patient monitored throughout procedure with no signs of LAST or immediate complications. Tolerated well. Ultrasound image placed in chart.  LANEY Paul, MD

## 2024-07-10 NOTE — H&P (Signed)
 Todd Myers    Chief Complaint: Left shoulder osteoarthritis HPI: The patient is a 83 y.o. male well-known to our practice after previous right shoulder reverse arthroplasty, presenting with chronic and progressively increasing left shoulder pain related to severe glenohumeral arthritis and associated rotator cuff dysfunction.  He is brought to the operating room today for planned left shoulder reverse arthroplasty.  Past Medical History:  Diagnosis Date   BPH associated with nocturia    w/ hx elevated psa , negative biopsy's   Cancer (HCC)    bladder cancer   Dyslipidemia    GERD (gastroesophageal reflux disease)    HTN (hypertension)    OA (osteoarthritis)    right shoulder and left shoulder   Wears glasses    Wears hearing aid in both ears       Past Surgical History:  Procedure Laterality Date   APPENDECTOMY  1980s   CATARACT EXTRACTION W/ INTRAOCULAR LENS  IMPLANT, BILATERAL  left 2017;  right 2016   CYSTOSCOPY  03/08/2023   Procedure: CYSTOSCOPY;  Surgeon: Renda Glance, MD;  Location: [facility];  Service: Urology;;   INGUINAL HERNIA REPAIR  x2  right  and x2  left in the 1980s   REVERSE SHOULDER ARTHROPLASTY Right 10/31/2018   Procedure: RIGHT REVERSE SHOULDER ARTHROPLASTY;  Surgeon: Melita Drivers, MD;  Location: [facility];  Service: Orthopedics;  Laterality: Right;  120 minutes   ROTATOR CUFF REPAIR Right 2009   TONSILLECTOMY     TRANSURETHRAL RESECTION OF BLADDER TUMOR N/A 03/08/2023   Procedure: TRANSURETHRAL RESECTION OF BLADDER TUMOR (TURBT);  Surgeon: Renda Glance, MD;  Location: [facility];  Service: Urology;  Laterality: N/A;  60 MINUTES NEEDED FOR CASE   TRANSURETHRAL RESECTION OF BLADDER TUMOR N/A 03/28/2023   Procedure: TRANSURETHRAL RESECTION OF BLADDER TUMOR (TURBT);  Surgeon: Renda Glance, MD;  Location: [facility];  Service: Urology;  Laterality: N/A;   TYMPANOPLASTY Right 1976    Family History  Problem Relation Age of Onset   COPD Mother    Heart disease  Father     Social History:  reports that he quit smoking about 33 years ago. His smoking use included cigarettes. He started smoking about 63 years ago. He has never used smokeless tobacco. He reports current alcohol  use. He reports that he does not use drugs.  BMI: Estimated body mass index is 23.05 kg/Todd as calculated from the following:   Height as of this encounter: 5' 2 (1.575 Todd).   Weight as of this encounter: 57.2 kg.  No results found for: ALBUMIN Diabetes: Patient does not have a diagnosis of diabetes.     Smoking Status:       Medications Prior to Admission  Medication Sig Dispense Refill   acetaminophen  (TYLENOL ) 500 MG tablet Take 500 mg by mouth every 6 (six) hours as needed for moderate pain.     calcium carbonate (TUMS - DOSED IN MG ELEMENTAL CALCIUM) 500 MG chewable tablet Chew 1 tablet by mouth at bedtime.     cholecalciferol (VITAMIN D3) 25 MCG (1000 UNIT) tablet Take 2,000 Units by mouth at bedtime.     cyclobenzaprine (FLEXERIL) 10 MG tablet Take 10 mg by mouth 3 (three) times daily as needed for muscle spasms.     famotidine (PEPCID) 20 MG tablet Take 20 mg by mouth at bedtime. Takes with Cialis     finasteride  (PROSCAR ) 5 MG tablet Take 5 mg by mouth every evening.     meclizine (ANTIVERT) 25 MG tablet Take 25  mg by mouth 3 (three) times daily as needed for dizziness.     Polyethyl Glycol-Propyl Glycol (SYSTANE OP) Place 1 drop into both eyes as needed (dry eyes).     simvastatin (ZOCOR) 20 MG tablet Take 20 mg by mouth at bedtime.     tadalafil (CIALIS) 5 MG tablet Take 5 mg by mouth daily before lunch.     tamsulosin (FLOMAX) 0.4 MG CAPS capsule Take 0.4 mg by mouth every evening.     valsartan (DIOVAN) 80 MG tablet Take 80 mg by mouth every evening.       Physical Exam: Left shoulder demonstrates painful and guarded motion as noted at his recent office visits.  Globally decreased strength to manual muscle testing.  Neurovascular intact in left upper  extremity.  Imaging studies confirm changes consistent with chronic severe glenohumeral arthritis and associated rotator cuff dysfunction and degeneration.  Vitals  Temp:  [98.1 F (36.7 C)] 98.1 F (36.7 C) (10/30 0600) Pulse Rate:  [78] 78 (10/30 0600) Resp:  [18] 18 (10/30 0600) BP: (126)/(79) 126/79 (10/30 0600) SpO2:  [96 %] 96 % (10/30 0600) Weight:  [57.2 kg] 57.2 kg (10/30 0600)  Assessment/Plan  Impression: Left shoulder osteoarthritis  Plan of Action: Procedure(s): ARTHROPLASTY, SHOULDER, TOTAL, REVERSE  Todd Myers Todd Myers 07/10/2024, 6:34 AM Contact # [phone]

## 2024-07-10 NOTE — Evaluation (Signed)
 Occupational Therapy Evaluation Patient Details Name: Todd Myers MRN: [id number] DOB: [birth year] Today's Date: 07/10/2024   History of Present Illness   Todd Myers is a 83 yo male s/p L Rev TSA on 07/10/24 PMH: R reverse TSA, BPH, Ca, dyslipidemia, OA B shoulders     Clinical Impressions PTA pt lives at home with wife and was independent prior to this am surgery.  Education completed regarding compensatory strategies for ADL tasks and functional mobility, management of sling, L ROM per specified parameters in the order set as indicated below, positioning of operative arm in sitting and supine and edema control, including use of Iceman Cold Therapy machine. Caregiver present for education, written handouts provided and reviewed using Teach Back and pt/caregiver verbalized/demonstrated understanding. Due to the below listed deficits, pt requires mod a assistance with ADL tasks and CGA  assist with functional mobility. Caregiver will be able to provide necessary level of assistance at discharge. Pt to follow up with MD to progress rehab of the operative shoulder. Wife present for all training. Issued a gait belt and amb to and from bathroom with + teach back.       If plan is discharge home, recommend the following:   A little help with walking and/or transfers;A lot of help with bathing/dressing/bathroom;Assistance with cooking/housework;Assist for transportation;Help with stairs or ramp for entrance     Functional Status Assessment   Patient has had a recent decline in their functional status and demonstrates the ability to make significant improvements in function in a reasonable and predictable amount of time.     Equipment Recommendations   None recommended by OT      Precautions/Restrictions   Precautions Precautions: Shoulder Shoulder FF 0-60; ER 0-20; Abd 0-45. ROM for ADL purposes only; AROM elbow/wrist/hand; OK for lap slides and pendulums; Loosen sling from  neck when arm is supported in sitting  Precaution Booklet Issued: Yes (comment) Required Braces or Orthoses: Sling Restrictions Weight Bearing Restrictions Per Provider Order: Yes LUE Weight Bearing Per Provider Order: Non weight bearing     Mobility Bed Mobility Overal bed mobility:  (in recliner for session and remained but educ on sleep positioning)                  Transfers Overall transfer level: Needs assistance Equipment used: None Transfers: Sit to/from Stand, Bed to chair/wheelchair/BSC Sit to Stand: Contact guard assist     Step pivot transfers: Contact guard assist     General transfer comment: trained wife in use of gait belt, amb to and from bathroom with CGA with wife teach back of safe handling      Balance Overall balance assessment: Mild deficits observed, not formally tested (issued gait belt and wife with + teach back)                                         ADL either performed or assessed with clinical judgement   ADL Overall ADL's : Needs assistance/impaired    Per orders, L shoulder parameters as follows for ADL tasks: Abd 0-45; ER 0-20; FF 0-60;  While moving within specified parameters, pt/caregiver instructed on bathing and how to donn/doff shirt, placing operative arm through sleeve first when donning and off last when doffing.Pt/caregiver educated on compensatory strategies for LB ADL and strategies to reduce risk of falls.  Pt/caregiver educated on donning/doffing sling and to wear the  sling at all times with the exception of ADL, and to loosen the neck strap of the sling when the operative arm is in a supported position when sitting. In sitting or supine, pt instructed to have a pillow behind and under their operative arm to provide support. If assist needed with ambulation, caregiver educated on the importance of walking on pt's non-operative side.  Education regarding use of IceMan Cold Therapy completed, including the  importance of using a barrier on the shoulder prior to positioning the wrap-on pad. Pt/caregiver verbalized/demonstrated understanding. Teach Back used while caregiver assisted with dressing pt and positioning wrap-on pad to facilitate DC.                                           Vision Baseline Vision/History: 1 Wears glasses              Pertinent Vitals/Pain Pain Assessment Pain Assessment: 0-10 Pain Score: 3  Pain Location: L shoulder Pain Descriptors / Indicators: Aching Pain Intervention(s): Limited activity within patient's tolerance, Monitored during session, Premedicated before session, Repositioned, Ice applied, Other (comment) (pillow support)     Extremity/Trunk Assessment Upper Extremity Assessment Upper Extremity Assessment: Left hand dominant;LUE deficits/detail LUE Deficits / Details: L post op nerve block remains active; L elbow, wrist, hand AAROM WFL LUE Coordination: decreased fine motor   Lower Extremity Assessment Lower Extremity Assessment: Overall WFL for tasks assessed   Cervical / Trunk Assessment Cervical / Trunk Assessment: Kyphotic   Communication Communication Communication: Impaired Factors Affecting Communication: Hearing impaired;Reduced clarity of speech   Cognition Arousal: Alert Behavior During Therapy: WFL for tasks assessed/performed                                 Following commands: Intact       Cueing  General Comments   Cueing Techniques: Verbal cues  L post op incision with waterproof dressing c/d/i   Exercises Exercises: Shoulder Shoulder Exercises Pendulum Exercise: 10 reps Elbow Flexion: AAROM, Left, 10 reps Elbow Extension: AAROM, Left, 10 reps Wrist Flexion: AAROM, Left, 10 reps Wrist Extension: AAROM, Left, 10 reps Digit Composite Flexion: AAROM, Left, 10 reps Composite Extension: AAROM, Left, 10 reps   Shoulder Instructions Shoulder Instructions Donning/doffing shirt  without moving shoulder: Caregiver independent with task;Patient able to independently direct caregiver Method for sponge bathing under operated UE: Caregiver independent with task;Patient able to independently direct caregiver Donning/doffing sling/immobilizer: Caregiver independent with task;Patient able to independently direct caregiver Correct positioning of sling/immobilizer: Caregiver independent with task;Patient able to independently direct caregiver Pendulum exercises (written home exercise program): Caregiver independent with task;Patient able to independently direct caregiver ROM for elbow, wrist and digits of operated UE: Caregiver independent with task;Patient able to independently direct caregiver Sling wearing schedule (on at all times/off for ADL's): Caregiver independent with task;Patient able to independently direct caregiver Proper positioning of operated UE when showering: Caregiver independent with task;Patient able to independently direct caregiver Positioning of UE while sleeping: Caregiver independent with task;Patient able to independently direct caregiver    Home Living Family/patient expects to be discharged to:: Private residence Living Arrangements: Spouse/significant other Available Help at Discharge: Family;Available 24 hours/day Type of Home: House Home Access: Stairs to enter [employer] of Steps: 4   Home Layout: Two level;Able to live on main level with bedroom/bathroom  Bathroom Shower/Tub: Runner, Broadcasting/film/video: Agricultural Consultant (2 wheels);Cane - single point;Shower seat          Prior Functioning/Environment Prior Level of Function : Independent/Modified Independent;Driving                    OT Problem List: Impaired UE functional use;Pain    AM-PAC OT 6 Clicks Daily Activity     Outcome Measure Help from another person eating meals?: A Little Help from another person taking care of personal  grooming?: A Little Help from another person toileting, which includes using toliet, bedpan, or urinal?: A Little Help from another person bathing (including washing, rinsing, drying)?: A Lot Help from another person to put on and taking off regular upper body clothing?: A Lot Help from another person to put on and taking off regular lower body clothing?: A Lot 6 Click Score: 15   End of Session Equipment Utilized During Treatment: Gait belt Nurse Communication: Mobility status;Other (comment) (toileting relayed)  Activity Tolerance: Patient tolerated treatment well Patient left: in bed;with call bell/phone within reach;with nursing/sitter in room;with family/visitor present  OT Visit Diagnosis: Other (comment);Pain (UE dysfunction) Pain - Right/Left: Left Pain - part of body: Arm                Time: 1115-1200 OT Time Calculation (min): 45 min Charges:  OT General Charges $OT Visit: 1 Visit OT Evaluation $OT Eval Low Complexity: 1 Low OT Treatments $Self Care/Home Management : 8-22 mins $Therapeutic Exercise: 8-22 mins  Todd Myers OT/L Acute Rehabilitation Department  [phone]  07/10/2024, 12:25 PM

## 2024-07-10 NOTE — Care Plan (Signed)
 Ortho Bundle Case Management Note  Patient Details  Name: Todd Myers MRN: [id number] Date of Birth: 1940-12-17  L Rev TSA on 07/10/24   DCP: Home with wife and daughter  DME: No needs. Ice machine at hospital.   PT: EO                   DME Arranged:  N/A DME Agency:  NA  HH Arranged:    HH Agency:     Additional Comments: Please contact me with any questions of if this plan should need to change.  Lyle Pepper, CCM  [facility] [phone]  Ext. [phone]   07/10/2024, 8:07 AM

## 2024-07-10 NOTE — Op Note (Signed)
 07/10/2024  9:04 AM  PATIENT:   Todd Myers  83 y.o. male  PRE-OPERATIVE DIAGNOSIS:  Left shoulder osteoarthritis with functional ankylosis and severe rotator cuff degeneration  POST-OPERATIVE DIAGNOSIS: Same  PROCEDURE: Left shoulder reverse arthroplasty lysing a press-fit size 9 Arthrex stem with a neutral metaphysis, +6 polyethylene insert, 39/+4 glenosphere and a small/+2 baseplate, 30 mm lag screw  SURGEON:  Thos Matsumoto, Franky BATTLE M.D.  ASSISTANTS: Randine Ricks, PA-C  Randine Ricks, PA-C was utilized as an geophysicist/field seismologist throughout this case, essential for help with positioning the patient, positioning extremity, tissue manipulation, implantation of the prosthesis, suture management, wound closure, and intraoperative decision-making.  ANESTHESIA:   General Endotracheal and interscalene block with Exparel   EBL: 200 cc  SPECIMEN: None  Drains: None   PATIENT DISPOSITION:  PACU - hemodynamically stable.    PLAN OF CARE: Discharge to home after PACU  Brief history:  Patient is an 83 year old gentleman well-known to our practice after previous right shoulder reverse arthroplasty that we performed several years ago that he has done very well with.  Presents with chronic and progressively increasing left shoulder pain related to severe osteoarthritis.  He is brought to the operating room today for planned left shoulder reverse arthroplasty.  Preoperatively, I counseled the patient regarding treatment options and risks versus benefits thereof.  Possible surgical complications were all reviewed including potential for bleeding, infection, neurovascular injury, persistent pain, loss of motion, anesthetic complication, failure of the implant, and possible need for additional surgery. They understand and accept and agrees with our planned procedure.   Procedure in detail:  After undergoing routine preop evaluation the patient received prophylactic antibiotics and interscalene block with  Exparel  was established in the holding area by the anesthesia department.  Subsequently placed spine on the operating table and underwent the smooth induction of a general endotracheal anesthesia.  Placed into the beachchair position and appropriately padded and protected.  The left shoulder girdle region was sterilely prepped and draped in standard fashion.  Timeout was called.  Deltopectoral approach left shoulder was made for an approximately 8 cm incision.  Skin flaps elevated dissection carried deeply and the deltopectoral interval was then developed from proximal to distal with the vein taken laterally.  Of note there were a number of tortuous varicosities emanating off of the cephalic vein and ultimately we ligated the vein with proximally and distally.  Adhesions were divided beneath the deltoid and the conjoined tendon was mobilized and retracted medially.  The long head biceps tendon was then tenodesed at the upper border the pectoralis major tendon with the proximal segment unroofed and excised.  The rotator cuff superiorly was split to the base of the coracoid and the subscap was then separated from the lesser tuberosity using electrocautery and the free margin was tagged with a pair of grasping suture tape sutures.  Capsular attachments were then divided from the anterior and inferior margins of the humeral neck and the humeral head was then delivered through the wound.  An extra medullary guide was then used to outline the proposed humeral head resection which we performed with an oscillating saw at approximately 20 degrees of retroversion.  Marginal osteophytes were removed with a rondure and a metal cap was then placed over the cut proximal humeral surface.  We then exposed the glenoid and performed a circumferential labral resection.  A guidepin was then directed into the inferoposterior central aspect of the glenoid and measured at 30 mm and reamed with the central followed by the  peripheral reamer  to a stable subchondral bony bed in preparation completed with a drill and tapped for a 30 mm lag screw.  Our baseplate was then assembled and inserted with vancomycin powder applied to the threads of the lag screw with excellent fixation achieved.  All of the peripheral locking screws were then placed using standard technique with excellent fixation.  A 39/+4 glenosphere was then impacted onto the baseplate and the central locking screw was placed.  We returned our attention back to the humeral canal where it was opened by hand reaming and ultimately broached up to a size 9 stem.  A neutral metaphyseal reaming guide was then used to prepare the metaphysis and a trial reduction showed good motion stability and soft tissue balance.  Our final implant was assembled.  The canal was irrigated cleaned and dried and the final implant was then impacted with excellent fixation and this did match the contralateral shoulder.  Trial reduction showed the best motion stability and soft tissue balance with a +6 poly.  The trial was then removed.  The final poly was impacted after the implant was cleaned and dried.  Our final reduction showed excellent motion stability and soft tissue balance.  We did identify a osteocartilaginous loose body in the subcoracoid recess which was removed.  The subscapularis was mobilized and confirmed to have good elasticity and was repaired back to the eyelets on the collar of the implant using the previously placed suture tape sutures.  Final irrigation was then completed.  Hemostasis was obtained.  Balance of her vancomycin powder was then sprayed liberally throughout the deep soft tissue planes.  Deltopectoral interval was reapproximated with a series of figure-of-eight number Vicryl sutures.  2-0 Monocryl used to close the subcu layer and intracuticular 3-0 Monocryl used to close the skin followed by Steri-Strips and a Aquacel dressing.  Left arm placed into a sling.  The patient was awakened,  extubated, and taken to the recovery room in stable condition.  Franky CHRISTELLA Pointer MD   Contact # [phone]

## 2024-07-10 NOTE — Discharge Instructions (Signed)

## 2024-07-10 NOTE — Anesthesia Procedure Notes (Signed)
 Procedure Name: Intubation Date/Time: 07/10/2024 7:45 AM  Performed by: Augusta Daved SAILOR, CRNAPre-anesthesia Checklist: Patient identified, Emergency Drugs available, Suction available and Patient being monitored Patient Re-evaluated:Patient Re-evaluated prior to induction Oxygen Delivery Method: Circle System Utilized Preoxygenation: Pre-oxygenation with 100% oxygen Induction Type: IV induction Ventilation: Mask ventilation without difficulty Laryngoscope Size: Glidescope and 3 Grade View: Grade I Tube type: Oral Tube size: 7.0 mm Number of attempts: 1 Airway Equipment and Method: Stylet and Oral airway Placement Confirmation: ETT inserted through vocal cords under direct vision, positive ETCO2 and breath sounds checked- equal and bilateral Secured at: 22 (at the lip) cm Tube secured with: Tape Dental Injury: Teeth and Oropharynx as per pre-operative assessment

## 2024-07-10 NOTE — Anesthesia Postprocedure Evaluation (Signed)
 Anesthesia Post Note  Patient: Rondle Lohse  Procedure(s) Performed: LEFT REVERSE TOTAL SHOULDER ARTHROPLASTY (Left: Shoulder)     Patient location during evaluation: PACU Anesthesia Type: General Level of consciousness: awake and alert Pain management: pain level controlled Vital Signs Assessment: post-procedure vital signs reviewed and stable Respiratory status: spontaneous breathing, nonlabored ventilation and respiratory function stable Cardiovascular status: blood pressure returned to baseline Postop Assessment: no apparent nausea or vomiting Anesthetic complications: no   No notable events documented.  Last Vitals:  Vitals:   07/10/24 1015 07/10/24 1030  BP: 125/79 128/73  Pulse: 78 76  Resp: 18 18  Temp:    SpO2: 94% 96%    Last Pain:  Vitals:   07/10/24 1030  TempSrc:   PainSc: 4                  Vertell Row

## 2024-07-11 ENCOUNTER — Encounter (HOSPITAL_COMMUNITY): Payer: Self-pay | Admitting: Orthopedic Surgery

## 2024-07-13 ENCOUNTER — Emergency Department (HOSPITAL_COMMUNITY): Admission: EM | Admit: 2024-07-13 | Discharge: 2024-07-14 | Disposition: A | Discharge status: Home / Self Care

## 2024-07-13 ENCOUNTER — Other Ambulatory Visit: Payer: Self-pay

## 2024-07-13 ENCOUNTER — Emergency Department (HOSPITAL_COMMUNITY)

## 2024-07-13 DIAGNOSIS — M25411 Effusion, right shoulder: Secondary | ICD-10-CM | POA: Diagnosis not present

## 2024-07-13 DIAGNOSIS — Z5189 Encounter for other specified aftercare: Secondary | ICD-10-CM

## 2024-07-13 DIAGNOSIS — K5900 Constipation, unspecified: Secondary | ICD-10-CM | POA: Diagnosis not present

## 2024-07-13 DIAGNOSIS — L299 Pruritus, unspecified: Secondary | ICD-10-CM | POA: Insufficient documentation

## 2024-07-13 DIAGNOSIS — I10 Essential (primary) hypertension: Secondary | ICD-10-CM | POA: Insufficient documentation

## 2024-07-13 DIAGNOSIS — Z4801 Encounter for change or removal of surgical wound dressing: Secondary | ICD-10-CM | POA: Diagnosis not present

## 2024-07-13 DIAGNOSIS — Z96611 Presence of right artificial shoulder joint: Secondary | ICD-10-CM | POA: Diagnosis not present

## 2024-07-13 DIAGNOSIS — Z96612 Presence of left artificial shoulder joint: Secondary | ICD-10-CM | POA: Diagnosis not present

## 2024-07-13 DIAGNOSIS — R195 Other fecal abnormalities: Secondary | ICD-10-CM | POA: Diagnosis not present

## 2024-07-13 LAB — CBC WITH DIFFERENTIAL/PLATELET
Abs Immature Granulocytes: 0.03 K/uL (ref 0.00–0.07)
Basophils Absolute: 0 K/uL (ref 0.0–0.1)
Basophils Relative: 0 %
Eosinophils Absolute: 0.2 K/uL (ref 0.0–0.5)
Eosinophils Relative: 3 %
HCT: 39.1 % (ref 39.0–52.0)
Hemoglobin: 13.1 g/dL (ref 13.0–17.0)
Immature Granulocytes: 0 %
Lymphocytes Relative: 20 %
Lymphs Abs: 1.4 K/uL (ref 0.7–4.0)
MCH: 32.2 pg (ref 26.0–34.0)
MCHC: 33.5 g/dL (ref 30.0–36.0)
MCV: 96.1 fL (ref 80.0–100.0)
Monocytes Absolute: 0.7 K/uL (ref 0.1–1.0)
Monocytes Relative: 10 %
Neutro Abs: 4.4 K/uL (ref 1.7–7.7)
Neutrophils Relative %: 67 %
Platelets: 192 K/uL (ref 150–400)
RBC: 4.07 MIL/uL — ABNORMAL LOW (ref 4.22–5.81)
RDW: 12.5 % (ref 11.5–15.5)
WBC: 6.7 K/uL (ref 4.0–10.5)
nRBC: 0 % (ref 0.0–0.2)

## 2024-07-13 LAB — COMPREHENSIVE METABOLIC PANEL WITH GFR
ALT: 18 U/L (ref 0–44)
AST: 28 U/L (ref 15–41)
Albumin: 3.5 g/dL (ref 3.5–5.0)
Alkaline Phosphatase: 61 U/L (ref 38–126)
Anion gap: 8 (ref 5–15)
BUN: 14 mg/dL (ref 8–23)
CO2: 27 mmol/L (ref 22–32)
Calcium: 8.8 mg/dL — ABNORMAL LOW (ref 8.9–10.3)
Chloride: 104 mmol/L (ref 98–111)
Creatinine, Ser: 1.02 mg/dL (ref 0.61–1.24)
GFR, Estimated: >60 mL/min (ref >60)
Glucose, Bld: 200 mg/dL — ABNORMAL HIGH (ref 70–99)
Potassium: 4 mmol/L (ref 3.5–5.1)
Sodium: 138 mmol/L (ref 135–145)
Total Bilirubin: 0.7 mg/dL (ref 0.0–1.2)
Total Protein: 6.5 g/dL (ref 6.5–8.1)

## 2024-07-13 MED ORDER — PREDNISONE 20 MG PO TABS
60.0000 mg | ORAL_TABLET | Freq: Once | ORAL | Status: AC
  Administered 2024-07-13: 60 mg via ORAL
  Filled 2024-07-13: qty 3

## 2024-07-13 MED ORDER — DIPHENHYDRAMINE HCL 25 MG PO CAPS
25.0000 mg | ORAL_CAPSULE | Freq: Once | ORAL | Status: AC
  Administered 2024-07-13: 25 mg via ORAL
  Filled 2024-07-13: qty 1

## 2024-07-13 NOTE — ED Triage Notes (Signed)
 Patient states increase swelling and redness on left shoulder started today. Patient denies N/V. Patient denies fever at home. S/P reversed total shoulder arthoplasty 10/30

## 2024-07-13 NOTE — ED Provider Notes (Signed)
 [place] EMERGENCY DEPARTMENT AT [facility] Provider Note   CSN: [id number] Arrival date & time: 07/13/24  2202     Patient presents with: Post-op Problem   Todd Myers is a 83 y.o. male with history of hypertension, GERD, dyslipidemia, status post reverse total shoulder arthroplasty on the right on 10/30.  Presents to ED complaining of warmth, redness and swelling to right shoulder.  States that he had unremarkable shoulder replacement on Thursday.  States that today he noticed that his left shoulder was more red, swollen and he had heat coming out of it.  He denies any pain to his left shoulder.  States he had a nerve block but reports that there is no significant pain.  Denies chest pain or shortness of breath.  Denies fevers, nausea or vomiting.  Reports that he is constipated and last had a bowel movement on Thursday but attributes this to taking pain medication.  Denies any significant abdominal pain. Reports that left shoulder is itching.   HPI     Prior to Admission medications   Medication Sig Start Date End Date Taking? Authorizing Provider  cephALEXin  (KEFLEX ) 500 MG capsule Take 1 capsule (500 mg total) by mouth 4 (four) times daily. 07/14/24  Yes Ruthell Lonni FALCON, PA-C  acetaminophen  (TYLENOL ) 500 MG tablet Take 500 mg by mouth every 6 (six) hours as needed for moderate pain.    [provider]  calcium carbonate (TUMS - DOSED IN MG ELEMENTAL CALCIUM) 500 MG chewable tablet Chew 1 tablet by mouth at bedtime.    [provider]  cholecalciferol (VITAMIN D3) 25 MCG (1000 UNIT) tablet Take 2,000 Units by mouth at bedtime.    [provider]  cyclobenzaprine (FLEXERIL) 10 MG tablet Take 10 mg by mouth 3 (three) times daily as needed for muscle spasms.    [provider]  cyclobenzaprine (FLEXERIL) 10 MG tablet Take 1 tablet (10 mg total) by mouth 3 (three) times daily as needed for muscle spasms. 07/10/24   Shuford, Randine, PA-C   famotidine (PEPCID) 20 MG tablet Take 20 mg by mouth at bedtime. Takes with Cialis    [provider]  finasteride  (PROSCAR ) 5 MG tablet Take 5 mg by mouth every evening.    [provider]  meclizine (ANTIVERT) 25 MG tablet Take 25 mg by mouth 3 (three) times daily as needed for dizziness.    [provider]  meloxicam  (MOBIC ) 15 MG tablet Take 1 tablet (15 mg total) by mouth daily. 07/10/24 07/10/25  Shuford, Randine, PA-C  ondansetron  (ZOFRAN ) 4 MG tablet Take 1 tablet (4 mg total) by mouth every 8 (eight) hours as needed for nausea or vomiting. 07/10/24   Shuford, Randine, PA-C  oxyCODONE -acetaminophen  (PERCOCET) 5-325 MG tablet Take 1 tablet by mouth every 4 (four) hours as needed (max 6 q). 07/10/24   Shuford, Randine, PA-C  Polyethyl Glycol-Propyl Glycol (SYSTANE OP) Place 1 drop into both eyes as needed (dry eyes).    [provider]  simvastatin (ZOCOR) 20 MG tablet Take 20 mg by mouth at bedtime. 04/15/14   [provider]  tadalafil (CIALIS) 5 MG tablet Take 5 mg by mouth daily before lunch.    [provider]  tamsulosin (FLOMAX) 0.4 MG CAPS capsule Take 0.4 mg by mouth every evening.    [provider]  valsartan (DIOVAN) 80 MG tablet Take 80 mg by mouth every evening.    [provider]    Allergies: Sulfa antibiotics  Review of Systems  Gastrointestinal:  Positive for constipation. Negative for nausea and vomiting.  Musculoskeletal:  Positive for arthralgias.    Updated Vital Signs BP (!) 151/75   Pulse 93   Temp 98.4 F (36.9 C)   Resp 16   SpO2 92%   Physical Exam Vitals and nursing note reviewed.  Constitutional:      General: He is not in acute distress.    Appearance: He is well-developed.  HENT:     Head: Normocephalic and atraumatic.  Eyes:     Conjunctiva/sclera: Conjunctivae normal.  Cardiovascular:     Rate and Rhythm: Normal rate and regular rhythm.     Heart sounds: No murmur  heard. Pulmonary:     Effort: Pulmonary effort is normal. No respiratory distress.     Breath sounds: Normal breath sounds.  Abdominal:     Palpations: Abdomen is soft.     Tenderness: There is no abdominal tenderness.     Comments: Abdomen soft, nontender  Musculoskeletal:        General: No swelling.     Cervical back: Neck supple.     Comments: Erythema, warmth to the left shoulder.  Reduced ROM secondary to pain.  2+ radial pulse.  Surgical incision clean dry and intact.  Skin:    General: Skin is warm and dry.     Capillary Refill: Capillary refill takes less than 2 seconds.  Neurological:     Mental Status: He is alert and oriented to person, place, and time. Mental status is at baseline.  Psychiatric:        Mood and Affect: Mood normal.     (all labs ordered are listed, but only abnormal results are displayed) Labs Reviewed  COMPREHENSIVE METABOLIC PANEL WITH GFR - Abnormal; Notable for the following components:      Result Value   Glucose, Bld 200 (*)    Calcium 8.8 (*)    All other components within normal limits  CBC WITH DIFFERENTIAL/PLATELET - Abnormal; Notable for the following components:   RBC 4.07 (*)    All other components within normal limits    EKG: None  Radiology: DG Shoulder Left Result Date: 07/13/2024 EXAM: 1 VIEW XRAY OF THE LEFT SHOULDER 07/13/2024 11:08:30 PM COMPARISON: None available. CLINICAL HISTORY: FINDINGS: BONES AND JOINTS: Changes of left shoulder reverse arthroplasty. No hardware or bony complicating feature. No acute fracture, subluxation, or dislocation. SOFT TISSUES: No abnormal calcifications. IMPRESSION: 1. Left shoulder reverse arthroplasty without hardware or bony complication. Electronically signed by: Franky Crease MD 07/13/2024 11:23 PM EST RP Workstation: [id number]   DG Abdomen 1 View Result Date: 07/13/2024 EXAM: 1 VIEW XRAY OF THE ABDOMEN 07/13/2024 11:08:30 PM COMPARISON: 01/11/2024 CLINICAL HISTORY: FINDINGS: BOWEL: Large  stool burden throughout the colon. No bowel obstruction. SOFT TISSUES: No opaque urinary calculi. BONES: No acute osseous abnormality. IMPRESSION: 1. Large stool burden throughout the colon. 2. No acute findings. Electronically signed by: Franky Crease MD 07/13/2024 11:22 PM EST RP Workstation: [id number]    Procedures   Medications Ordered in the ED  cephALEXin  (KEFLEX ) capsule 500 mg (has no administration in time range)  predniSONE (DELTASONE) tablet 60 mg (60 mg Oral Given 07/13/24 2306)  diphenhydrAMINE  (BENADRYL ) capsule 25 mg (25 mg Oral Given 07/13/24 2305)    Medical Decision Making Amount and/or Complexity of Data Reviewed Labs: ordered. Radiology: ordered.  Risk Prescription drug management.   83 year old male presents for evaluation.  On exam, he is afebrile and nontachycardic.  His lung  sounds are clear bilaterally, there is no hypoxia.  Abdomen soft and compressible.  Neuroexam at baseline.  Patient left shoulder with erythema, warmth, reduced range of motion secondary to pain.  2+ radial pulse.  Prescribe refill.  Surgical incision clean dry and intact.  Patient reports that there is itchiness to his left arm.  Consideration for hypersensitivity reaction.  He reports he has had Betadine applied to his skin in the past without issue.  Given Benadryl  and prednisone and on reassessment reports that there is no difference to his symptoms.  Labs drawn include CBC, CMP, x-ray of left shoulder.  CBC without leukocytosis or anemia.  Metabolic panel is unremarkable.  X-ray of left shoulder shows stable postsurgical changes.  At this time we will treat patient for cellulitis with Keflex  which will take 4 times a day for 10 days.  Advised patient to follow-up in the morning with Dr. Rosea office, call and make an appointment to be seen earlier than November 19.  The patient was given strict return precautions to include fevers, nausea or vomiting and he voiced understanding.  He was also  advised to take MiraLAX  and Colace for his constipation which I suspect is most likely secondary to narcotic pain medication.  He voiced understanding.  He is stable to discharge home.     Final diagnoses:  Visit for wound check    ED Discharge Orders          Ordered    cephALEXin  (KEFLEX ) 500 MG capsule  4 times daily        07/14/24 0007               Ruthell Lonni FALCON, PA-C 07/14/24 0008    Trine Raynell Moder, MD 07/14/24 [phone]

## 2024-07-14 DIAGNOSIS — Z4801 Encounter for change or removal of surgical wound dressing: Secondary | ICD-10-CM | POA: Diagnosis not present

## 2024-07-14 MED ORDER — CEPHALEXIN 500 MG PO CAPS
500.0000 mg | ORAL_CAPSULE | Freq: Once | ORAL | Status: AC
  Administered 2024-07-14: 500 mg via ORAL
  Filled 2024-07-14: qty 1

## 2024-07-14 MED ORDER — CEPHALEXIN 500 MG PO CAPS: 500.0000 mg | ORAL_CAPSULE | Freq: Four times a day (QID) | ORAL | 0 refills | Status: AC

## 2024-07-14 NOTE — Discharge Instructions (Addendum)
 It was a pleasure taking part in your care.  As we discussed, your workup here is reassuring.  We are starting on antibiotic called Keflex  for 10 days.  Take this 4 times a day for 10 days.  Please follow-up with Dr. Rosea office in the morning.  Call, advise them that you were in the ED overnight and asked to move your appointment up to be reevaluated.  If you develop fevers, nausea, vomiting or increased pain in your left shoulder please return to the ED.  For your constipation, you may take MiraLAX  and Colace as prescribed.  Return to ED if new symptoms.

## 2024-07-16 DIAGNOSIS — Z96612 Presence of left artificial shoulder joint: Secondary | ICD-10-CM | POA: Diagnosis not present

## 2024-07-16 DIAGNOSIS — Z5189 Encounter for other specified aftercare: Secondary | ICD-10-CM | POA: Diagnosis not present

## 2024-07-23 DIAGNOSIS — M25512 Pain in left shoulder: Secondary | ICD-10-CM | POA: Diagnosis not present

## 2024-07-23 DIAGNOSIS — R600 Localized edema: Secondary | ICD-10-CM | POA: Diagnosis not present

## 2024-07-25 DIAGNOSIS — Z8551 Personal history of malignant neoplasm of bladder: Secondary | ICD-10-CM | POA: Diagnosis not present

## 2024-08-12 DIAGNOSIS — M25512 Pain in left shoulder: Secondary | ICD-10-CM | POA: Diagnosis not present

## 2024-08-14 DIAGNOSIS — M25512 Pain in left shoulder: Secondary | ICD-10-CM | POA: Diagnosis not present

## 2024-08-18 DIAGNOSIS — Z8551 Personal history of malignant neoplasm of bladder: Secondary | ICD-10-CM | POA: Diagnosis not present

## 2024-08-19 DIAGNOSIS — M25512 Pain in left shoulder: Secondary | ICD-10-CM | POA: Diagnosis not present

## 2024-08-21 DIAGNOSIS — M25512 Pain in left shoulder: Secondary | ICD-10-CM | POA: Diagnosis not present

## 2024-08-25 DIAGNOSIS — Z8551 Personal history of malignant neoplasm of bladder: Secondary | ICD-10-CM | POA: Diagnosis not present

## 2024-08-26 DIAGNOSIS — M25512 Pain in left shoulder: Secondary | ICD-10-CM | POA: Diagnosis not present

## 2024-09-06 ENCOUNTER — Emergency Department (HOSPITAL_BASED_OUTPATIENT_CLINIC_OR_DEPARTMENT_OTHER)
Admission: EM | Admit: 2024-09-06 | Discharge: 2024-09-06 | Disposition: A | Discharge status: Home / Self Care | Attending: Emergency Medicine | Admitting: Emergency Medicine

## 2024-09-06 ENCOUNTER — Encounter (HOSPITAL_BASED_OUTPATIENT_CLINIC_OR_DEPARTMENT_OTHER): Payer: Self-pay

## 2024-09-06 ENCOUNTER — Other Ambulatory Visit: Payer: Self-pay

## 2024-09-06 DIAGNOSIS — J101 Influenza due to other identified influenza virus with other respiratory manifestations: Secondary | ICD-10-CM | POA: Insufficient documentation

## 2024-09-06 DIAGNOSIS — R509 Fever, unspecified: Secondary | ICD-10-CM | POA: Diagnosis present

## 2024-09-06 DIAGNOSIS — J111 Influenza due to unidentified influenza virus with other respiratory manifestations: Secondary | ICD-10-CM

## 2024-09-06 LAB — RESP PANEL BY RT-PCR (RSV, FLU A&B, COVID)  RVPGX2
Influenza A by PCR: POSITIVE — AB
Influenza B by PCR: NEGATIVE
Resp Syncytial Virus by PCR: NEGATIVE
SARS Coronavirus 2 by RT PCR: NEGATIVE

## 2024-09-06 MED ORDER — ONDANSETRON 4 MG PO TBDP
4.0000 mg | ORAL_TABLET | Freq: Once | ORAL | Status: AC
  Administered 2024-09-06: 4 mg via ORAL
  Filled 2024-09-06: qty 1

## 2024-09-06 MED ORDER — OSELTAMIVIR PHOSPHATE 75 MG PO CAPS: 75.0000 mg | ORAL_CAPSULE | Freq: Two times a day (BID) | ORAL | 0 refills | Status: AC

## 2024-09-06 MED ORDER — BENZONATATE 100 MG PO CAPS: 100.0000 mg | ORAL_CAPSULE | Freq: Three times a day (TID) | ORAL | 0 refills | Status: AC

## 2024-09-06 NOTE — Discharge Instructions (Addendum)
 You were seen for your influenza infection in the emergency department.   At home, please use Tylenol  for your muscle aches and fevers.  Please use over-the-counter cough medication or tea with honey for your cough. Take the tessalon  for your cough. Take the tamiflu  for your influenza.   Follow-up with your primary doctor in 2-3 days regarding your visit.  This may be over the phone.  Return immediately to the emergency department if you experience any of the following: Difficulty breathing, or any other concerning symptoms.    Thank you for visiting our Emergency Department. It was a pleasure taking care of you today.

## 2024-09-06 NOTE — ED Provider Notes (Signed)
 " [place] EMERGENCY DEPARTMENT AT [facility] Provider Note   CSN: [id number] Arrival date & time: 09/06/24  9462     Patient presents with: Fever   Todd Myers is a 83 y.o. male.   83 year old male who presents to the emergency department with fever, cough, and chills that started yesterday.  Cough has been productive.  No chest pain.  No significant shortness of breath.  Has been taking Tylenol  at home for symptoms.         Prior to Admission medications  Medication Sig Start Date End Date Taking? Authorizing Provider  benzonatate  (TESSALON ) 100 MG capsule Take 1 capsule (100 mg total) by mouth every 8 (eight) hours. 09/06/24  Yes Yolande Lamar BROCKS, MD  oseltamivir  (TAMIFLU ) 75 MG capsule Take 1 capsule (75 mg total) by mouth every 12 (twelve) hours. 09/06/24  Yes Yolande Lamar BROCKS, MD  acetaminophen  (TYLENOL ) 500 MG tablet Take 500 mg by mouth every 6 (six) hours as needed for moderate pain.    [provider]  calcium carbonate (TUMS - DOSED IN MG ELEMENTAL CALCIUM) 500 MG chewable tablet Chew 1 tablet by mouth at bedtime.    [provider]  cephALEXin  (KEFLEX ) 500 MG capsule Take 1 capsule (500 mg total) by mouth 4 (four) times daily. 07/14/24   Ruthell Lonni FALCON, PA-C  cholecalciferol (VITAMIN D3) 25 MCG (1000 UNIT) tablet Take 2,000 Units by mouth at bedtime.    [provider]  cyclobenzaprine  (FLEXERIL ) 10 MG tablet Take 10 mg by mouth 3 (three) times daily as needed for muscle spasms.    [provider]  cyclobenzaprine  (FLEXERIL ) 10 MG tablet Take 1 tablet (10 mg total) by mouth 3 (three) times daily as needed for muscle spasms. 07/10/24   Shuford, Randine, PA-C  famotidine (PEPCID) 20 MG tablet Take 20 mg by mouth at bedtime. Takes with Cialis    [provider]  finasteride  (PROSCAR ) 5 MG tablet Take 5 mg by mouth every evening.    [provider]  meclizine (ANTIVERT) 25 MG tablet Take 25 mg by mouth 3  (three) times daily as needed for dizziness.    [provider]  meloxicam  (MOBIC ) 15 MG tablet Take 1 tablet (15 mg total) by mouth daily. 07/10/24 07/10/25  Shuford, Randine, PA-C  ondansetron  (ZOFRAN ) 4 MG tablet Take 1 tablet (4 mg total) by mouth every 8 (eight) hours as needed for nausea or vomiting. 07/10/24   Shuford, Randine, PA-C  oxyCODONE -acetaminophen  (PERCOCET) 5-325 MG tablet Take 1 tablet by mouth every 4 (four) hours as needed (max 6 q). 07/10/24   Shuford, Randine, PA-C  Polyethyl Glycol-Propyl Glycol (SYSTANE OP) Place 1 drop into both eyes as needed (dry eyes).    [provider]  simvastatin (ZOCOR) 20 MG tablet Take 20 mg by mouth at bedtime. 04/15/14   [provider]  tadalafil (CIALIS) 5 MG tablet Take 5 mg by mouth daily before lunch.    [provider]  tamsulosin (FLOMAX) 0.4 MG CAPS capsule Take 0.4 mg by mouth every evening.    [provider]  valsartan (DIOVAN) 80 MG tablet Take 80 mg by mouth every evening.    [provider]    Allergies: Sulfa antibiotics    Review of Systems  Updated Vital Signs BP (!) 147/69 (BP Location: Left Arm)   Pulse 95   Temp (!) 100.4 F (38 C) (Oral)   Resp 16   Ht 5' 2 (1.575 m)  Wt 58.1 kg   SpO2 96%   BMI 23.41 kg/m   Physical Exam Constitutional:      Appearance: Normal appearance.  HENT:     Head: Normocephalic and atraumatic.     Nose: Nose normal.     Mouth/Throat:     Mouth: Mucous membranes are moist.     Pharynx: Oropharynx is clear.  Cardiovascular:     Rate and Rhythm: Normal rate and regular rhythm.     Pulses: Normal pulses.     Heart sounds: Normal heart sounds.  Pulmonary:     Effort: Pulmonary effort is normal.     Breath sounds: Normal breath sounds.  Neurological:     Mental Status: He is alert.     (all labs ordered are listed, but only abnormal results are displayed) Labs Reviewed  RESP PANEL BY RT-PCR (RSV, FLU A&B, COVID)  RVPGX2 -  Abnormal; Notable for the following components:      Result Value   Influenza A by PCR POSITIVE (*)    All other components within normal limits    EKG: None  Radiology: No results found.   Procedures   Medications Ordered in the ED  ondansetron  (ZOFRAN -ODT) disintegrating tablet 4 mg (4 mg Oral Given 09/06/24 0559)                                    Medical Decision Making Risk Prescription drug management.   83 year old male who presents to the emergency department with URI type symptoms since yesterday  Initial Ddx:  Influenza, COVID, URI, pneumonia, COPD  MDM/Course:  Patient presents emergency department URI type symptoms since yesterday.  Was found to be influenza positive in triage.  On exam he is overall well-appearing.  Does have a mild fever of 100.4.  Normal work of breathing.  Lungs are clear to auscultation bilaterally satting well on room air.  Had labs last month that showed normal renal and liver function.  Performed shared decision making with him regarding Tamiflu  and he would like to try it at this point in time along with Tessalon  Perles.  Sent to his pharmacy.  On follow-up with his primary doctor in several days.    This patient presents to the ED for concern of complaints listed in HPI, this involves an extensive number of treatment options, and is a complaint that carries with it a high risk of complications and morbidity. Disposition including potential need for admission considered.   Dispo: DC Home. Return precautions discussed including, but not limited to, those listed in the AVS. Allowed pt time to ask questions which were answered fully prior to dc.  I have reviewed the patients home medications and made adjustments as needed Records reviewed Outpatient Clinic Notes Social Determinants of health:  Geriatric  Portions of this note were generated with Scientist, clinical (histocompatibility and immunogenetics). Dictation errors may occur despite best attempts at proofreading.      Final diagnoses:  Influenza    ED Discharge Orders          Ordered    oseltamivir  (TAMIFLU ) 75 MG capsule  Every 12 hours        09/06/24 0806    benzonatate  (TESSALON ) 100 MG capsule  Every 8 hours        09/06/24 0807               Yolande Lamar BROCKS, MD 09/06/24 1809  "

## 2024-09-06 NOTE — ED Triage Notes (Signed)
 Pt to ED from home with c/o fever and chills onset of 1 day ago. Pt reports a productive cough. Has been taking tylenol  at home with no relief. VSS, NADN.
